# Patient Record
Sex: Female | Born: 1940 | Race: White | Hispanic: No | Marital: Married
Health system: Midwestern US, Community
[De-identification: ages and names within clinical notes are randomized; demographics above are authoritative.]

## PROBLEM LIST (undated history)

## (undated) DIAGNOSIS — M5416 Radiculopathy, lumbar region: Secondary | ICD-10-CM

## (undated) DIAGNOSIS — G894 Chronic pain syndrome: Secondary | ICD-10-CM

## (undated) DIAGNOSIS — G5601 Carpal tunnel syndrome, right upper limb: Secondary | ICD-10-CM

## (undated) DIAGNOSIS — G309 Alzheimer's disease, unspecified: Secondary | ICD-10-CM

## (undated) DIAGNOSIS — F02B0 Dementia in other diseases classified elsewhere, moderate, without behavioral disturbance, psychotic disturbance, mood disturbance, and anxiety: Secondary | ICD-10-CM

## (undated) DIAGNOSIS — R1013 Epigastric pain: Secondary | ICD-10-CM

## (undated) DIAGNOSIS — G8929 Other chronic pain: Secondary | ICD-10-CM

## (undated) DIAGNOSIS — M353 Polymyalgia rheumatica: Secondary | ICD-10-CM

## (undated) DIAGNOSIS — F419 Anxiety disorder, unspecified: Secondary | ICD-10-CM

## (undated) DIAGNOSIS — K219 Gastro-esophageal reflux disease without esophagitis: Secondary | ICD-10-CM

## (undated) DIAGNOSIS — F32A Depression, unspecified: Secondary | ICD-10-CM

## (undated) DIAGNOSIS — F329 Major depressive disorder, single episode, unspecified: Secondary | ICD-10-CM

## (undated) DIAGNOSIS — N819 Female genital prolapse, unspecified: Secondary | ICD-10-CM

## (undated) DIAGNOSIS — M5136 Other intervertebral disc degeneration, lumbar region: Secondary | ICD-10-CM

## (undated) DIAGNOSIS — M549 Dorsalgia, unspecified: Secondary | ICD-10-CM

## (undated) DIAGNOSIS — M51369 Other intervertebral disc degeneration, lumbar region without mention of lumbar back pain or lower extremity pain: Principal | ICD-10-CM

## (undated) DIAGNOSIS — M810 Age-related osteoporosis without current pathological fracture: Secondary | ICD-10-CM

## (undated) DIAGNOSIS — F03B Unspecified dementia, moderate, without behavioral disturbance, psychotic disturbance, mood disturbance, and anxiety (HCC): Secondary | ICD-10-CM

## (undated) DIAGNOSIS — G479 Sleep disorder, unspecified: Secondary | ICD-10-CM

## (undated) HISTORY — DX: Dorsalgia, unspecified: M54.9

## (undated) HISTORY — PX: VEIN LIGATION AND STRIPPING: SHX2653

## (undated) HISTORY — DX: Polymyalgia rheumatica: M35.3

## (undated) HISTORY — PX: CATARACT EXTRACTION: SUR2

## (undated) HISTORY — DX: Other chronic pain: G89.29

## (undated) HISTORY — PX: HAMMER TOE SURGERY: SHX385

## (undated) HISTORY — DX: Female genital prolapse, unspecified: N81.9

---

## 2003-01-24 HISTORY — PX: ESOPHAGOGASTRODUODENOSCOPY: SHX1529

## 2005-09-07 ENCOUNTER — Ambulatory Visit: Payer: Self-pay | Admitting: Internal Medicine

## 2006-04-05 ENCOUNTER — Ambulatory Visit: Payer: Self-pay | Admitting: Internal Medicine

## 2006-09-12 ENCOUNTER — Ambulatory Visit: Payer: Self-pay | Admitting: Internal Medicine

## 2006-09-17 ENCOUNTER — Ambulatory Visit (HOSPITAL_COMMUNITY): Admission: RE | Admit: 2006-09-17 | Discharge: 2006-09-17 | Payer: Self-pay | Admitting: Internal Medicine

## 2010-01-23 HISTORY — PX: OTHER SURGICAL HISTORY: SHX169

## 2010-05-13 ENCOUNTER — Emergency Department (HOSPITAL_COMMUNITY)
Admission: EM | Admit: 2010-05-13 | Discharge: 2010-05-13 | Disposition: A | Discharge status: Nursing Home (Includes ICF) | Payer: Medicare (Managed Care) | Attending: Emergency Medicine | Admitting: Emergency Medicine

## 2010-05-13 ENCOUNTER — Emergency Department (HOSPITAL_COMMUNITY): Payer: Medicare (Managed Care)

## 2010-05-13 ENCOUNTER — Inpatient Hospital Stay (HOSPITAL_COMMUNITY)
Admission: AD | Admit: 2010-05-13 | Discharge: 2010-05-16 | DRG: 287 | Disposition: A | Discharge status: Home / Self Care | Payer: Medicare (Managed Care) | Source: Other Acute Inpatient Hospital | Attending: Cardiology | Admitting: Cardiology

## 2010-05-13 DIAGNOSIS — I4892 Unspecified atrial flutter: Secondary | ICD-10-CM | POA: Diagnosis present

## 2010-05-13 DIAGNOSIS — I4891 Unspecified atrial fibrillation: Secondary | ICD-10-CM | POA: Insufficient documentation

## 2010-05-13 DIAGNOSIS — E7800 Pure hypercholesterolemia, unspecified: Secondary | ICD-10-CM | POA: Insufficient documentation

## 2010-05-13 DIAGNOSIS — Z7901 Long term (current) use of anticoagulants: Secondary | ICD-10-CM | POA: Insufficient documentation

## 2010-05-13 DIAGNOSIS — M549 Dorsalgia, unspecified: Secondary | ICD-10-CM | POA: Diagnosis present

## 2010-05-13 DIAGNOSIS — R110 Nausea: Secondary | ICD-10-CM | POA: Insufficient documentation

## 2010-05-13 DIAGNOSIS — R55 Syncope and collapse: Secondary | ICD-10-CM | POA: Diagnosis present

## 2010-05-13 DIAGNOSIS — R0789 Other chest pain: Principal | ICD-10-CM | POA: Diagnosis present

## 2010-05-13 DIAGNOSIS — M810 Age-related osteoporosis without current pathological fracture: Secondary | ICD-10-CM | POA: Diagnosis present

## 2010-05-13 LAB — PROTIME-INR
INR: 2.57 — ABNORMAL HIGH (ref 0.00–1.49)
Prothrombin Time: 27.7 seconds — ABNORMAL HIGH (ref 11.6–15.2)

## 2010-05-13 LAB — DIFFERENTIAL
Basophils Absolute: 0 10*3/uL (ref 0.0–0.1)
Basophils Relative: 0 % (ref 0–1)
Neutro Abs: 4.1 10*3/uL (ref 1.7–7.7)
Neutrophils Relative %: 58 % (ref 43–77)

## 2010-05-13 LAB — CBC
Hemoglobin: 13.2 g/dL (ref 12.0–15.0)
MCHC: 32 g/dL (ref 30.0–36.0)
RBC: 4.48 MIL/uL (ref 3.87–5.11)

## 2010-05-13 LAB — BASIC METABOLIC PANEL
CO2: 28 mEq/L (ref 19–32)
Calcium: 9.1 mg/dL (ref 8.4–10.5)
Chloride: 103 mEq/L (ref 96–112)
Glucose, Bld: 138 mg/dL — ABNORMAL HIGH (ref 70–99)
Sodium: 138 mEq/L (ref 135–145)

## 2010-05-13 LAB — CARDIAC PANEL(CRET KIN+CKTOT+MB+TROPI)
CK, MB: 3.2 ng/mL (ref 0.3–4.0)
Total CK: 137 U/L (ref 7–177)
Troponin I: 0.21 ng/mL — ABNORMAL HIGH (ref 0.00–0.06)

## 2010-05-13 LAB — CK TOTAL AND CKMB (NOT AT ARMC): Relative Index: 2.3 (ref 0.0–2.5)

## 2010-05-13 LAB — POCT CARDIAC MARKERS: Myoglobin, poc: 89.8 ng/mL (ref 12–200)

## 2010-05-13 LAB — TROPONIN I: Troponin I: 0.24 ng/mL — ABNORMAL HIGH (ref 0.00–0.06)

## 2010-05-14 LAB — LIPID PANEL
Cholesterol: 202 mg/dL — ABNORMAL HIGH (ref 0–200)
HDL: 73 mg/dL (ref >39)
LDL Cholesterol: 107 mg/dL — ABNORMAL HIGH (ref 0–99)
Triglycerides: 109 mg/dL (ref <150)

## 2010-05-14 LAB — PROTIME-INR: INR: 2.26 — ABNORMAL HIGH (ref 0.00–1.49)

## 2010-05-14 LAB — CARDIAC PANEL(CRET KIN+CKTOT+MB+TROPI): Relative Index: 1.9 (ref 0.0–2.5)

## 2010-05-16 LAB — PROTIME-INR: INR: 1.63 — ABNORMAL HIGH (ref 0.00–1.49)

## 2010-05-31 NOTE — Discharge Summary (Signed)
NAME:  Rebecca Ingram, Rebecca Ingram NO.:  [id number]  MEDICAL RECORD NO.:  [id number]           PATIENT TYPE:  I  LOCATION:  3741                         FACILITY:  [facility]  PHYSICIAN:  Vonna Kotyk R. Jacinto Halim, MD       DATE OF BIRTH:  1940/09/30  DATE OF ADMISSION:  05/13/2010 DATE OF DISCHARGE:  05/16/2010                              DISCHARGE SUMMARY   DISCHARGE DIAGNOSES: 1. Slow atrial flutter with 2:1 conduction 2. Near syncope, probably related to paroxysmal atrial flutter.  No     heart block evident during the hospitalization. 3. Hyperlipidemia, mild. 4. No significant coronary artery disease by cardiac catheterization     done for chest pain.  Chest pain probably of noncardiac etiology.  HISTORY:  [name] is a 70 year old active female with a history of paroxysmal atrial fibrillation who has been evaluated at [facility] for possible ablation.  She was recently been placed on Coumadin to see if she would tolerate Coumadin after the ablation.  She has been followed by Dr. Erenest Rasher at [facility].  She was doing well until day of admission, that is on May 13, 2010.  She had 3 episodes of near syncope that lasted a few seconds.  She was initially seen at an Urgent Care in [place], [place] where she was found to be in atrial flutter and because of severe headache and being on Coumadin, he referred her to go to the emergency room to exclude subdural hematoma.  She was seen at [facility] where she was found to be in atrial flutter with 2:1 conduction with minimally positive troponin at 0.24.  CK-MB was negative for myocardial injury.  She was admitted the hospital and evaluated for the same.  She also had complained of upper abdominal and lower chest discomfort. Because of this, we thought she could potentially have unstable angina and was admitted for further evaluation.  She was ruled out for myocardial infarction.  Peak troponin was 0.24, which was the first  set, the second set was 0.08.  After she was in the hospital, we started her on metoprolol at 12.5 mg p.o. b.i.d.  Over the last 48 hours, she has not had any more further atrial flutter being on metoprolol.  We continued all her home medications otherwise.  She remained stable and underwent cardiac catheterization on the day of discharge, that is May 16, 2010, and was found to have only very minimal plaque in the LAD.  She was felt to be stable for discharge.  After discussing with the family, she does not want to be on Lovenox bridging.  Her INR had drifted down to 1.6 after her Coumadin was held for 2 days.  She used receive 8 mg of Coumadin at home and had been therapeutic on initial admission to the hospital at 2.57.  DISCHARGE MEDICATIONS: 1. Metoprolol XL 25 mg half tablet p.o. q.a.m. 2. Propafenone 150 mg p.o. t.i.d. 3. Zocor 10 mg p.o. at bedtime. 4. Coumadin 8 mg p.o. at bedtime. 5. Opana 5 mg p.o. daily and 10 mg p.o. at bedtime. 6. Oxybutynin 5 mg half  tablet p.o. b.i.d. 7. Gabapentin 600 mg p.o. at bedtime.  She will follow up with her primary care physician in [place] and her cardiologist at [facility].  She has not provided the name of the family care physician to me hence I can not forward this letter.     Cristy Hilts. Jacinto Halim, MD     JRG/MEDQ  D:  05/16/2010  T:  05/16/2010  Job:  [id number]  cc:   Forrestine Him, MD  Electronically Signed by Yates Decamp MD on 05/31/2010 09:07:00 AM

## 2010-05-31 NOTE — H&P (Signed)
NAME:  Rebecca Ingram, Rebecca Ingram NO.:  [id number]  MEDICAL RECORD NO.:  [id number]           PATIENT TYPE:  I  LOCATION:  3741                         FACILITY:  [facility]  PHYSICIAN:  Vonna Kotyk R. Jacinto Halim, MD       DATE OF BIRTH:  05/31/1940  DATE OF ADMISSION:  05/13/2010 DATE OF DISCHARGE:  05/13/2010                             HISTORY & PHYSICAL   REASON FOR ADMISSION:  Acute coronary syndrome, chest pain, non-ST- elevation myocardial infarction.  HISTORY:  Rebecca Ingram is a very pleasant 70 year old female who has history of paroxysmal atrial fibrillation.  Otherwise, no significant cardiovascular history.  She had been doing well until yesterday.  She felt nauseous and dizzy, but otherwise felt well yesterday and this morning when she woke up, she had a total of 3 episodes of near syncope. She stated that while walking to the mailbox, she suddenly felt that she was going to pass out but this resolved immediately.  [facility] she had felt near syncope.  Again, just before she got into the car, she was heading home, she again felt similar symptoms.  She lives in [place] and presented at [facility], an Urgent Care Facility.  There, she was also complaining of headache that had just started a few minutes before that.  Given her headache and history of the patient being on Coumadin, she was told to go to the emergency department.  She was evaluated at [facility] and eventually transferred over here to [facility].  The patient states that she also had started to develop chest discomfort in the middle of her chest.  This started while she was in the emergency department in [facility].  She said it was very mild, 1/10 in intensity, did not daily bother her.  She states at that time, it is pretty much gone.  REVIEW OF SYSTEMS:  She has not had any bleeding complications from Coumadin.  She denies any bowel or bladder  disturbances.  No tarry stools.  No TIA or neurological deficits.  She is not a diabetic.  She does have osteoporosis and also has chronic backache.  Other systems are negative.  PAST MEDICAL HISTORY:  Significant for osteoporosis, atrial fibrillation diagnosed 2 years ago, and is being considered for atrial fibrillation ablation.  History of depression and remote tonsillectomy.  SOCIAL HISTORY:  She lives with her husband.  She is very active.  Does not drink alcohol.  Does not use any tobacco products.  HOME MEDICATIONS: 1. Metoprolol 25 mg 1/2 tablet p.r.n. with palpitations. 2. Oxybutynin 5 mg in the morning and 10 mg in the evening. 3. Propafenone 150 mg t.i.d. 4. Gabapentin, dose is not known at bedtime. 5. Coumadin 8 mg p.o. at bedtime.  ALLERGIES:  To PENICILLIN and ASPARTAME which caused her to have rash. FLU SHOTS is quoted as one of her allergies.  FAMILY HISTORY:  There is no history of premature coronary artery disease.  However, parents had coronary artery disease in their late 12s.  Father also had a stroke in his  109s.  Mother had atrial fibrillation, congestive heart failure.  PHYSICAL EXAMINATION:  GENERAL:  She is moderately built, well- nourished.  She appears to be in no acute distress.  She appears younger than her stated age. VITAL SIGNS:  Temperature of 97.8, pulse is 60 beats with regular respiration 16, blood pressure 120/68 mmHg.  The orthostatics were recorded at [facility] which were negative for any orthostatic changes. CARDIAC:  S1 and S2 was normal without any gallop or murmur. CHEST:  Clear without any rhonchi. ABDOMEN:  Soft, nontender without any organomegaly.  Bowel sounds heard in all 4 quadrants. EXTREMITIES:  Revealed full range of movements without any edema or tenderness.  Her initial EKG done at [facility] at 1426 reveals presence of atrial flutter with 2:1 conduction.  Repeat EKG that was done on May 13, 2010,  revealed presence of sinus rhythm with early repolarization. No evidence of ischemia.  There is left atrial abnormality.  IMPRESSION: 1. Near syncope.  I suspect this could be either sick sinus syndrome     or AV nodal blockade.  The patient is on antiarrhythmic therapy     with flecainide. 2. History of atrial fibrillation being considered for atrial     fibrillation ablation by Dr. Erenest Rasher.  She, in last 2     months, was placed on Coumadin and also placed on propafenone. 3. No history of hyperlipidemia or hypertension.  No history of     transient ischemic attack or strokes.  RECOMMENDATIONS: 1. Chest discomfort that started at [facility] that is     totally resolved.  Her first set of point-of-care markers were     positive with elevated troponin of 0.24.  Her CK and CK-MB were     negative for myocardial injury. 2. Coumadin coagulopathy.  Her INR today is therapeutic at 2.57.  The     patient takes 8 mg of Coumadin at night. 3. Normal CBC and BMP.  RECOMMENDATIONS:  At this point, I am not very convinced that this is acute coronary syndrome.  However, I cannot completely exclude this.  I will continue with the Coumadin tonight and depending on her symptomatology tomorrow, I will decide whether she needs cardiac catheterization or not.  I explained to the patient and her husband regarding proceeding with cardiac catheterization and they understand less than a percent risk of death, stroke, heart attack, urgent bypass, bleeding, infection, but not limited to this.  The patient is Jehovah's Witness and does not want to be transfused.  I also give alternatives of doing stress testing.  The patient states that she has had a stress test within a year and it was told to be normal.  I will continue with aspirin only and tomorrow depending on her INR, we will start her on heparin.  I will consider doing cardiac catheterization on Monday or sooner if her chest  pain would recur or her cardiac markers continued to climb.  Further recommendations will follow.     Cristy Hilts. Jacinto Halim, MD     JRG/MEDQ  D:  05/13/2010  T:  05/14/2010  Job:  [id number]  cc:   Forrestine Him, MD  Electronically Signed by Yates Decamp MD on 05/31/2010 09:07:07 AM

## 2010-05-31 NOTE — Cardiovascular Report (Signed)
NAME:  Rebecca Ingram, Rebecca Ingram NO.:  [id number]  MEDICAL RECORD NO.:  [id number]           PATIENT TYPE:  I  LOCATION:  3741                         FACILITY:  [facility]  PHYSICIAN:  Vonna Kotyk R. Jacinto Halim, MD       DATE OF BIRTH:  1940/04/11  DATE OF PROCEDURE:  05/16/2010 DATE OF DISCHARGE:                           CARDIAC CATHETERIZATION   PROCEDURE PERFORMED: 1. Left ventriculography. 2. Selective right and left coronary arteriography.  INDICATIONS:  Ms. Azelea Seguin is a 70 year old female who has history of paroxysmal atrial fibrillation and is being followed at [facility] for possible ablation.  She has no history of hypertension.  She was admitted to the hospital after she had near-syncopal episodes.  She was ruled out for myocardial infarction; however, her troponins were minimally positive at 0.29.  Her EKG essentially demonstrated slow atrial flutter with 2:1 conduction and converting to sinus rhythm.  I do not suspect acute coronary syndrome; however, because of positive troponins and the patient's preference, she is now brought to the cardiac catheterization lab to evaluate her coronary anatomy.  HEMODYNAMIC DATA:  The left ventricular pressure was 109/80 with end- diastolic pressure of 17 mmHg.  Aortic pressure was 112/47 with a mean of 81 mmHg.  There was no pressure gradient across the aortic valve.  ANGIOGRAPHIC DATA:  Left ventricle.  Left ventricular systolic function was normal with ejection fraction of 60% to 65% without any regional wall motion abnormality.  No significant mitral regurgitation.  Right coronary artery.  Right coronary artery is a large vessel and a dominant vessel.  It is smooth and normal.  Left main coronary artery.  Left main coronary artery is short and bifurcates.  It is normal.  Circumflex coronary artery.  Circumflex coronary artery is a moderate caliber vessel, which is smooth and normal.  LAD.  LAD is a large-caliber vessel.  Gives  origin to a very large diagonal one.  Just after the diagonal one, there is a 20% focal stenosis in the LAD.  Mid to distal LAD also has a 20% focal stenosis. Otherwise, the LAD is large-caliber, smooth, and normal.  Large diagonal one is also normal.  IMPRESSION: 1. No significant coronary arteries by cardiac catheterization. 2. Normal left ventricular systolic function. 3. Mildly elevated left ventricular end-diastolic pressure.  RECOMMENDATIONS:  I discussed the findings with the patient and family. At this point, she will be continued on primary prevention only.  The patient's family states that she has been intolerant to statins; however, I have convinced her to be on a very small dose of Zocor at 10 mg for primary prevention given plaque of 20% in the LAD.  I will be discharging her today.  They do not want Lovenox bridging. Her INR was 1.6 this morning.  Her INR was therapeutic on admission on 8 mg p.o. at bedtime of Coumadin.  We will restart Coumadin at 8 mg before discharge.  She will follow up with the [facility] for further management of her atrial fibrillation, probably now has converted to atrial flutter with propafenone.  TECHNIQUE OF THE PROCEDURE:  Under sterile precautions, using a 6-French right radial access, a 6-French TIG #4 catheter was advanced into the ascending aorta, then selective right and left coronary arteriography was performed.  Same catheter was utilized to perform left ventriculography in the RAO projection.  The catheter was then pulled out of body over exchange length J-wire.  The patient tolerated the procedure well.  There were no immediate complication noted.  Hemostasis was obtained by applying TR band.     Cristy Hilts. Jacinto Halim, MD     JRG/MEDQ  D:  05/16/2010  T:  05/16/2010  Job:  [id number]  cc:   Forrestine Him, MD  Electronically Signed by Yates Decamp MD on 05/31/2010 09:07:13 AM

## 2010-06-07 NOTE — Assessment & Plan Note (Signed)
NAME:  Rebecca Ingram, Rebecca Ingram                 CHART#:  [id number]   DATE:  09/12/2006                       DOB:  [birth year]   CHIEF COMPLAINT:  Followup infectious diarrhea/new problem  epigastric/chest pain.   SUBJECTIVE:  The patient is a 70 year old female who is here for  followup infectious diarrhea. She states that her diarrhea has resolved  and she is doing quite well. Actually she now has the tendency to become  constipation and can go up to three days without a bowel movement. She  is not using laxatives at this time. She complains of a daily burning  discomfort to her epigastrium and chest. She complains of daily  heartburn for the last three months. She also complains of a constant  sharp mid abdominal pain that has been there for about a month. Her  symptoms are worse in the morning and not necessarily associated with  meals. She denies any dysphagia or odynophagia. Denies any nausea or  vomiting. Pain usually lasts between one and three hours. She did try  daily omeprazole 20 mg daily. Did seem to help with the heartburn.  However, it made the abdominal pain worse per her report. She had been  taking Naprosyn about 3-4 times a week for the last six months with a  history of arthritis and degenerative disc disease. Denies any rectal  bleeding or melena. Her heart rate has remained stable. She describes  the pain as a 4/10 on a pain scale.   She had an EGD on Jun 02, 2003 with a history of sore throat and  possible reflux. She was found to have a minimal hiatal hernia and an  otherwise normal examination. Protonix twice daily did not help her  symptoms at that time. She has tried Scientist, research (medical) with no relief. She complains  of increased belching.   CURRENT MEDICATIONS:  See the list from September 12, 2006.   ALLERGIES:  FLU VACCINE.   PHYSICAL EXAMINATION:  VITAL SIGNS: Weight 111 pounds. Height 64 inches.  Temperature 97.7, blood pressure 120/60, pulse 60.  GENERAL: The patient is a  thin, Caucasian female who is alert, oriented  and pleasant and cooperative in no acute distress.  HEENT: Sclerae are  clear and anicteric. Conjunctivae pink. Oropharynx  pink and moist without any lesions.  CHEST: HEART: Regular rate and rhythm. Normal S1, S2. No murmurs,  clicks, rubs or gallops.  ABDOMEN: Positive bowel sounds in all four quadrants. No bruits  auscultated. Soft and nontender, nondistended without palpable mass or  hepatosplenomegaly. No rebound, tenderness or guarding.  EXTREMITIES: Without clubbing or edema bilaterally.  SKIN: Pink, warm and dry without rash or jaundice.   ASSESSMENT:  The patient is a 70 year old female with a history of post  infectious diarrhea which has resolved.   She has a new complaint of burning, chest pain and a sharp  epigastric/mid abdominal pain for the last couple of months. Her  symptoms are suspicious for heartburn and acid reflux. However, she has  not responded to PPI trials previously making this less likely. It is  possible she could have cholelithiasis or gallbladder disease.   PLAN:  1. Gastroesophageal reflux disease and constipation literature given      for her review.  2. Zegerid 40 mg samples to be taken once daily to see if  this      relieves her pain. I have given her a prescription for 30 with one      refill.  3. Abdominal ultrasound.  4. CBC and LFTs today. __________       Lorenza Burton, N.P.  Electronically Signed     R. Roetta Sessions, M.D.  Electronically Signed    KJ/MEDQ  D:  09/13/2006  T:  09/13/2006  Job:  [id number]

## 2012-02-14 IMAGING — CR DG CHEST 2V
2 series · 2 of 2 positions shown · non-contrast
Comparison: None

CLINICAL DATA: Syncope

CHEST - 2 VIEW

[view not recorded (1 of 2)]
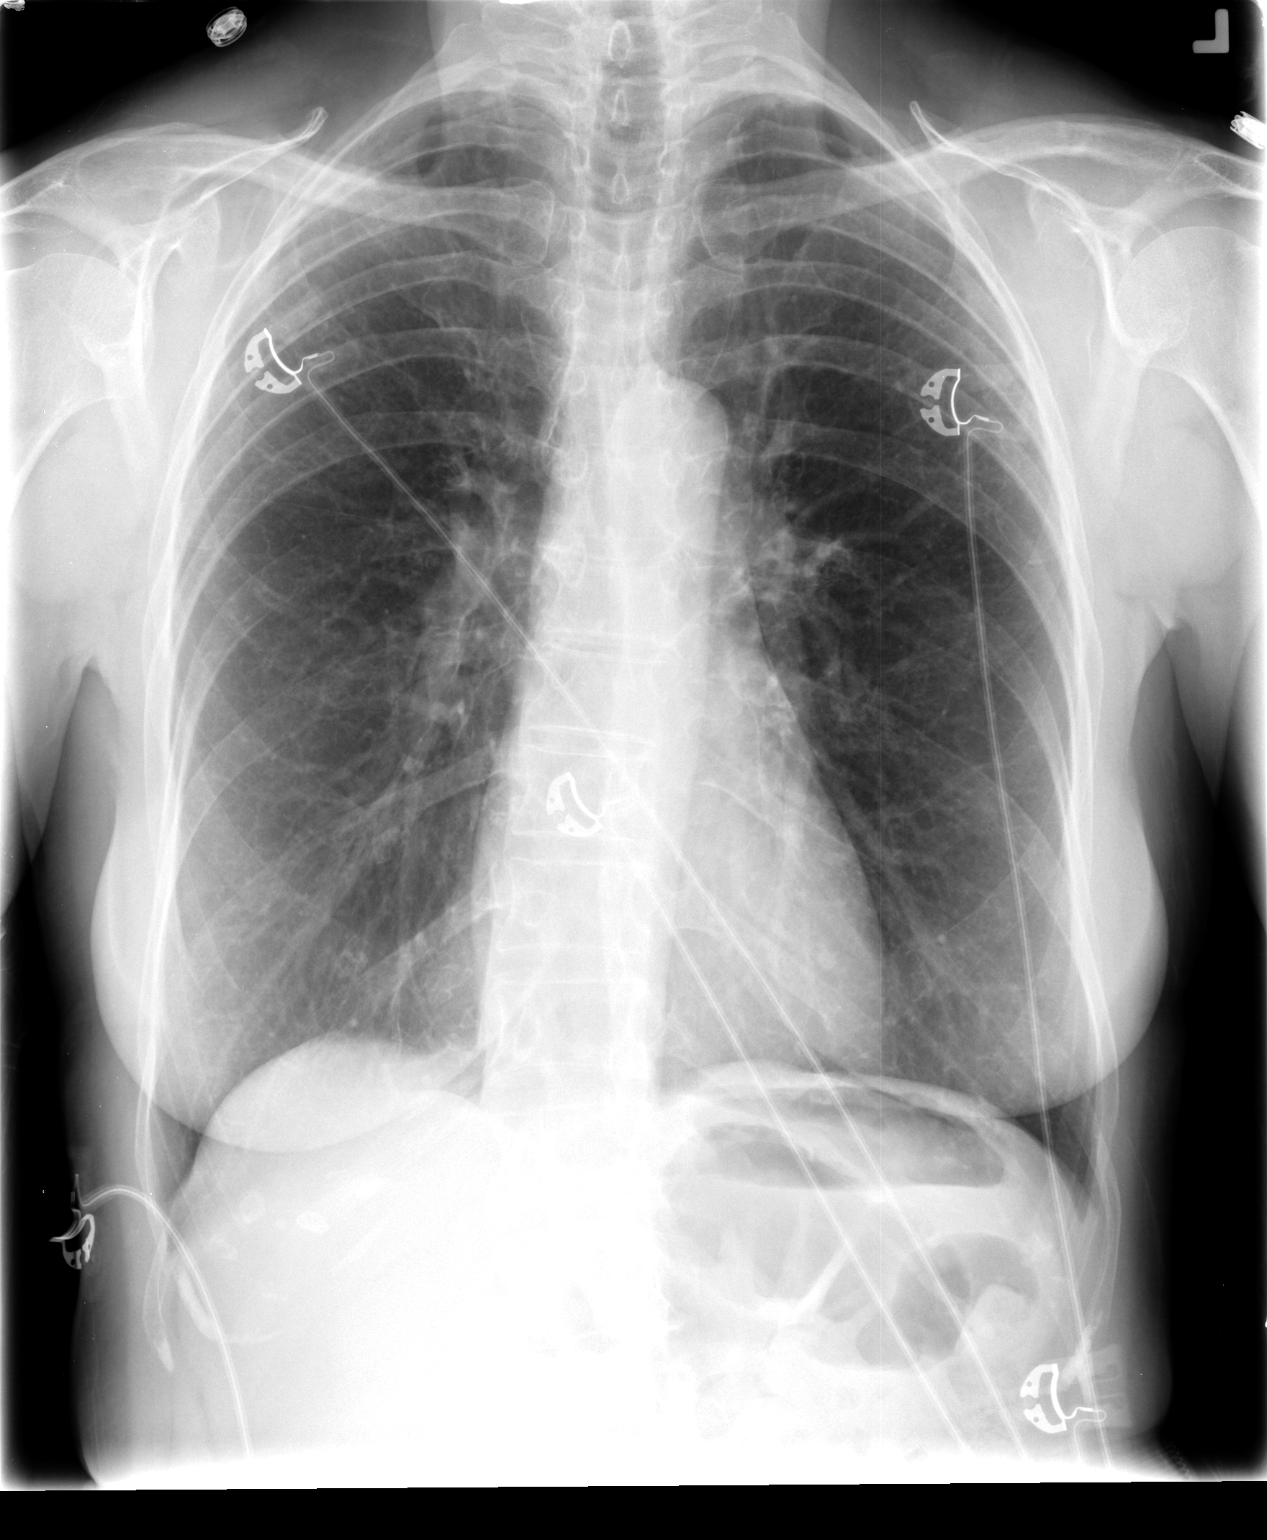

[view not recorded (2 of 2)]
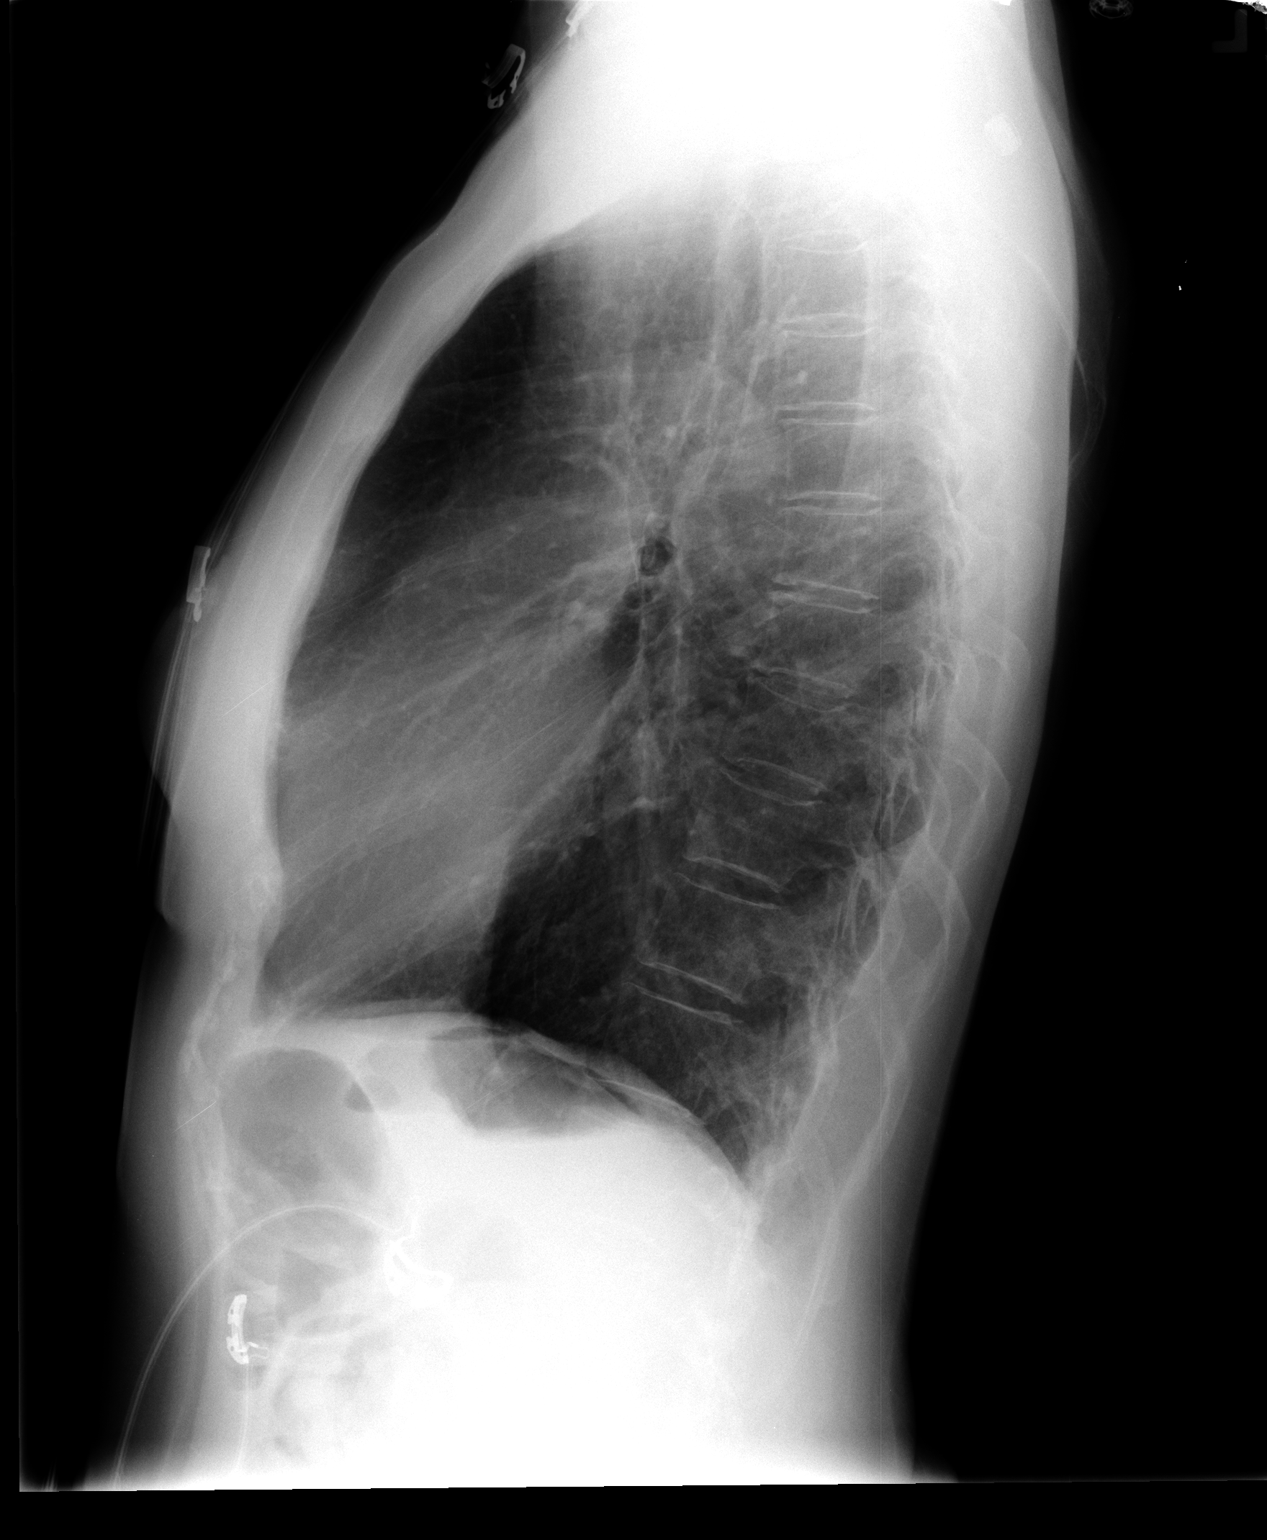

[2 of 2 positions shown; findings below may reference images not displayed]

FINDINGS: Normal heart size, mediastinal contours, and pulmonary vascularity.
Emphysematous changes without infiltrate or effusion.
No pneumothorax.
Bones appear demineralized.
Broad-based dextroconvex thoracolumbar scoliosis.
IMPRESSION: Emphysematous changes.
No acute abnormalities.

## 2013-01-23 HISTORY — PX: SMALL INTESTINE SURGERY: SHX150

## 2014-10-05 ENCOUNTER — Ambulatory Visit (INDEPENDENT_AMBULATORY_CARE_PROVIDER_SITE_OTHER): Payer: Medicare Other | Admitting: Podiatry

## 2014-10-05 ENCOUNTER — Ambulatory Visit (INDEPENDENT_AMBULATORY_CARE_PROVIDER_SITE_OTHER): Payer: Medicare Other

## 2014-10-05 VITALS — BP 137/57 | HR 70 | Resp 16

## 2014-10-05 DIAGNOSIS — L600 Ingrowing nail: Secondary | ICD-10-CM | POA: Diagnosis not present

## 2014-10-05 DIAGNOSIS — M2040 Other hammer toe(s) (acquired), unspecified foot: Secondary | ICD-10-CM

## 2014-10-05 NOTE — Progress Notes (Signed)
   Subjective:    Patient ID: Rebecca Ingram, female    DOB: 04-28-1940, 74 y.o.   MRN: [id number]  HPI Patient presents with bilateral ingrown toenails, great toes, on both sides.  Patient also presents with bilateral hammertoes. Pt stated, "Right foot hurts more".    Review of Systems  All other systems reviewed and are negative.      Objective:   Physical Exam        Assessment & Plan:

## 2014-10-05 NOTE — Patient Instructions (Addendum)
ANTIBACTERIAL SOAP INSTRUCTIONS  THE DAY AFTER PROCEDURE  Please follow the instructions your doctor has marked.   Shower as usual. Before getting out, place a drop of antibacterial liquid soap (Dial) on a wet, clean washcloth.  Gently wipe washcloth over affected area.  Afterward, rinse the area with warm water.  Blot the area dry with a soft cloth and cover with antibiotic ointment (neosporin, polysporin, bacitracin) and band aid or gauze and tape  Place 3-4 drops of antibacterial liquid soap in a quart of warm tap water.  Submerge foot into water for 20 minutes.  If bandage was applied after your procedure, leave on to allow for easy lift off, then remove and continue with soak for the remaining time.  Next, blot area dry with a soft cloth and cover with a bandage.  Apply other medications as directed by your doctor, such as cortisporin otic solution (eardrops) or neosporin antibiotic ointment    Pre-Operative Instructions  Congratulations, you have decided to take an important step to improving your quality of life.  You can be assured that the doctors of [facility] will be with you every step of the way.  1. Plan to be at the surgery center/hospital at least 1 (one) hour prior to your scheduled time unless otherwise directed by the surgical center/hospital staff.  You must have a responsible adult accompany you, remain during the surgery and drive you home.  Make sure you have directions to the surgical center/hospital and know how to get there on time. 2. For hospital based surgery you will need to obtain a history and physical form from your family physician within 1 month prior to the date of surgery- we will give you a form for you primary physician.  3. We make every effort to accommodate the date you request for surgery.  There are however, times where surgery dates or times have to be moved.  We will contact you as soon as possible if a change in schedule is required.   4. No  Aspirin/Ibuprofen for one week before surgery.  If you are on aspirin, any non-steroidal anti-inflammatory medications (Mobic, Aleve, Ibuprofen) you should stop taking it 7 days prior to your surgery.  You make take Tylenol  For pain prior to surgery.  5. Medications- If you are taking daily heart and blood pressure medications, seizure, reflux, allergy, asthma, anxiety, pain or diabetes medications, make sure the surgery center/hospital is aware before the day of surgery so they may notify you which medications to take or avoid the day of surgery. 6. No food or drink after midnight the night before surgery unless directed otherwise by surgical center/hospital staff. 7. No alcoholic beverages 24 hours prior to surgery.  No smoking 24 hours prior to or 24 hours after surgery. 8. Wear loose pants or shorts- loose enough to fit over bandages, boots, and casts. 9. No slip on shoes, sneakers are best. 10. Bring your boot with you to the surgery center/hospital.  Also bring crutches or a walker if your physician has prescribed it for you.  If you do not have this equipment, it will be provided for you after surgery. 11. If you have not been contracted by the surgery center/hospital by the day before your surgery, call to confirm the date and time of your surgery. 12. Leave-time from work may vary depending on the type of surgery you have.  Appropriate arrangements should be made prior to surgery with your employer. 13. Prescriptions will be  provided immediately following surgery by your doctor.  Have these filled as soon as possible after surgery and take the medication as directed. 14. Remove nail polish on the operative foot. 15. Wash the night before surgery.  The night before surgery wash the foot and leg well with the antibacterial soap provided and water paying special attention to beneath the toenails and in between the toes.  Rinse thoroughly with water and dry well with a towel.  Perform this wash  unless told not to do so by your physician.  Enclosed: 1 Ice pack (please put in freezer the night before surgery)   1 Hibiclens skin cleaner   Pre-op Instructions  If you have any questions regarding the instructions, do not hesitate to call our office.  [place]: [address] [place], [place] [address] [phone]  [place]: [address], [place], [place] [address] [phone]  [place]: [address], [place] [address] [phone]  Dr. Santiago Bumpers DPM, Dr. Cristie Hem DPM Dr. Alvan Dame DPM, Dr. Ardelle Anton DPM, Dr. Marlowe Aschoff DPM

## 2014-10-06 ENCOUNTER — Telehealth: Payer: Self-pay | Admitting: *Deleted

## 2014-10-06 NOTE — Progress Notes (Signed)
Subjective:     Patient ID: Rebecca Ingram, female   DOB: Jun 25, 1940, 74 y.o.   MRN: [id number]  HPI patient presents stating I have painful ingrown toenails on both feet and also I have corns on my right foot fifth toe and fourth toe that are very bothersome. Patient states that she's tried to trim and had previous surgery done on the fifth toe that was not successful and she cannot take the pain in the toes. States the ingrown's are also very painful and she cannot take care of them herself   Review of Systems  All other systems reviewed and are negative.      Objective:   Physical Exam  Constitutional: She is oriented to person, place, and time.  Cardiovascular: Intact distal pulses.   Musculoskeletal: Normal range of motion.  Neurological: She is oriented to person, place, and time.  Skin: Skin is warm.  Nursing note and vitals reviewed.  neurovascular status intact muscle strength adequate range of motion within normal limits. Patient's noted to have good digital perfusion is well oriented 3 and is noted to have incurvated hallux nailbeds bilateral with pain in both the medial and lateral corners of both feet. 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 Of right foot at this point I do think the nails need to be fixed and I explained procedure and risk and she wants the surgery. At this time I infiltrated each hallux 60 mg like Marcaine mixture removed the borders exposed matrix and applied phenol 3 applications 30 seconds all 4 borders followed by alcohol lavage and sterile dressing. Gave instructions on soaks and reappoint for that and then discussed toes and also  discussed with her husband this problem and they want it fixed. At this time I have recommended arthroplasty digit for right distal digit 5 right and distal medial exostectomy digit 5 right. I allowed patient to read consent form and spent a great deal of time going over alternative treatments and complications and they're well aware of this and wants surgery and signs consent form after review. I explained told recovery can be upwards of 6 months to one year with no long-term guarantees and they understand this given all preoperative instructions at this time

## 2014-10-06 NOTE — Telephone Encounter (Signed)
Left message for pt at [phone] (Home #) to check to see how they were doing from their ingrown toenail procedure that was done on Monday, October 05, 2014. Waiting for a response.

## 2014-10-07 ENCOUNTER — Telehealth: Payer: Self-pay | Admitting: *Deleted

## 2014-10-07 NOTE — Telephone Encounter (Signed)
Pt states she received a call yesterday from our office, requesting status after her toenail surgery.  Pt states she is fine and looking forward to her surgery on 10/12/2014.

## 2014-10-12 ENCOUNTER — Ambulatory Visit (INDEPENDENT_AMBULATORY_CARE_PROVIDER_SITE_OTHER): Payer: Medicare Other | Admitting: Podiatry

## 2014-10-12 ENCOUNTER — Encounter: Payer: Self-pay | Admitting: Podiatry

## 2014-10-12 VITALS — BP 112/51 | HR 63 | Resp 16

## 2014-10-12 DIAGNOSIS — L600 Ingrowing nail: Secondary | ICD-10-CM

## 2014-10-12 DIAGNOSIS — M2040 Other hammer toe(s) (acquired), unspecified foot: Secondary | ICD-10-CM

## 2014-10-12 NOTE — Patient Instructions (Signed)
Pre-Operative Instructions  Congratulations, you have decided to take an important step to improving your quality of life.  You can be assured that the doctors of [facility] will be with you every step of the way.  1. Plan to be at the surgery center/hospital at least 1 (one) hour prior to your scheduled time unless otherwise directed by the surgical center/hospital staff.  You must have a responsible adult accompany you, remain during the surgery and drive you home.  Make sure you have directions to the surgical center/hospital and know how to get there on time. 2. For hospital based surgery you will need to obtain a history and physical form from your family physician within 1 month prior to the date of surgery- we will give you a form for you primary physician.  3. We make every effort to accommodate the date you request for surgery.  There are however, times where surgery dates or times have to be moved.  We will contact you as soon as possible if a change in schedule is required.   4. No Aspirin/Ibuprofen for one week before surgery.  If you are on aspirin, any non-steroidal anti-inflammatory medications (Mobic, Aleve, Ibuprofen) you should stop taking it 7 days prior to your surgery.  You make take Tylenol  For pain prior to surgery.  5. Medications- If you are taking daily heart and blood pressure medications, seizure, reflux, allergy, asthma, anxiety, pain or diabetes medications, make sure the surgery center/hospital is aware before the day of surgery so they may notify you which medications to take or avoid the day of surgery. 6. No food or drink after midnight the night before surgery unless directed otherwise by surgical center/hospital staff. 7. No alcoholic beverages 24 hours prior to surgery.  No smoking 24 hours prior to or 24 hours after surgery. 8. Wear loose pants or shorts- loose enough to fit over bandages, boots, and casts. 9. No slip on shoes, sneakers are best. 10. Bring  your boot with you to the surgery center/hospital.  Also bring crutches or a walker if your physician has prescribed it for you.  If you do not have this equipment, it will be provided for you after surgery. 11. If you have not been contracted by the surgery center/hospital by the day before your surgery, call to confirm the date and time of your surgery. 12. Leave-time from work may vary depending on the type of surgery you have.  Appropriate arrangements should be made prior to surgery with your employer. 13. Prescriptions will be provided immediately following surgery by your doctor.  Have these filled as soon as possible after surgery and take the medication as directed. 14. Remove nail polish on the operative foot. 15. Wash the night before surgery.  The night before surgery wash the foot and leg well with the antibacterial soap provided and water paying special attention to beneath the toenails and in between the toes.  Rinse thoroughly with water and dry well with a towel.  Perform this wash unless told not to do so by your physician.  Enclosed: 1 Ice pack (please put in freezer the night before surgery)   1 Hibiclens skin cleaner   Pre-op Instructions  If you have any questions regarding the instructions, do not hesitate to call our office.  [place]: [address] [place], [place] [address] [phone]  [place]: [address] [place], [place] [address] [phone]  [place]: [address]  [place], [place] [address] [phone]  Dr. Richard   Tuchman DPM, Dr. Norman Regal DPM Dr. Richard Sikora DPM, Dr. M. Todd Hyatt DPM, Dr. Kathryn Egerton DPM 

## 2014-10-12 NOTE — Progress Notes (Signed)
Subjective:     Patient ID: Rebecca Ingram, female   DOB: February 18, 1940, 74 y.o.   MRN: [id number]  HPI patient states that my nails are doing well and I wanted to discuss again the correction of my hammertoes   Review of Systems     Objective:   Physical Exam Neurovascular status intact no change in health history with patient noted to have well-healed surgical site hallux nails bilateral and keratotic lesions fourth and fifth toes right with rotation of the fifth toe noted    Assessment:     Well-healing surgical site hallux bilateral with hammertoe deformity fourth and fifth toes right foot    Plan:     Reviewed conditions and continue soaks for the ingrown toenails and discussed correction of the digital deformities. Patient wants surgery and understands complications and we reviewed procedures and recovery. Scheduled for outpatient surgery in the next several weeks

## 2014-10-20 ENCOUNTER — Encounter: Payer: Self-pay | Admitting: *Deleted

## 2014-10-20 DIAGNOSIS — M25774 Osteophyte, right foot: Secondary | ICD-10-CM | POA: Diagnosis not present

## 2014-10-20 DIAGNOSIS — M2041 Other hammer toe(s) (acquired), right foot: Secondary | ICD-10-CM

## 2014-10-22 ENCOUNTER — Encounter: Payer: Self-pay | Admitting: Podiatry

## 2014-10-23 ENCOUNTER — Telehealth: Payer: Self-pay | Admitting: *Deleted

## 2014-10-23 NOTE — Telephone Encounter (Signed)
I'm calling to see how you're doing from your surgery.  "I'm doing fairly well.  I'm keeping my foot elevated.  I noticed a little pain in my calf.  It comes and goes though.  I haven't noticed any swelling, fever or redness in it.  Pain medication does marvelous.  Everything was fine at the surgical center."  Keep an eye on your calf.  If you notice any of the mentioned symptoms please call on-call pager number.  Continue to elevate as much as possible.  It's normally recommended to only be up 15 minutes per hour.  Always wear your shoe when you are up and about.  "Okay, thanks for calling to check on me."

## 2014-10-23 NOTE — Progress Notes (Signed)
Surgery performed at [facility] for Hammer Toe Repair 5th distal and Exostectomy 5th right foot.  Prescription was written for Vicodin 10/325 mg, quantity 25.

## 2014-10-30 ENCOUNTER — Ambulatory Visit (INDEPENDENT_AMBULATORY_CARE_PROVIDER_SITE_OTHER): Payer: Medicare Other | Admitting: Podiatry

## 2014-10-30 DIAGNOSIS — M2040 Other hammer toe(s) (acquired), unspecified foot: Secondary | ICD-10-CM

## 2014-10-30 DIAGNOSIS — Z9889 Other specified postprocedural states: Secondary | ICD-10-CM

## 2014-10-31 NOTE — Progress Notes (Signed)
Subjective:     Patient ID: Rebecca Ingram, female   DOB: August 17, 1940, 74 y.o.   MRN: [id number]  HPI patient presents with husband stating she's doing well   Review of Systems     Objective:   Physical Exam Neurovascular status intact negative Homans sign noted fourth and fifth toes right are healing well with wound edges well coapted and good alignment noted    Assessment:     Doing well after digital surgery 45 right    Plan:     Reviewed x-rays reapplied sterile dressing advised on continued open toed shoe gear usage and reduced activity and reappoint 2 weeks for suture removal or earlier if needed

## 2014-11-17 ENCOUNTER — Ambulatory Visit (INDEPENDENT_AMBULATORY_CARE_PROVIDER_SITE_OTHER): Payer: Medicare Other

## 2014-11-17 DIAGNOSIS — M2040 Other hammer toe(s) (acquired), unspecified foot: Secondary | ICD-10-CM

## 2014-11-17 DIAGNOSIS — Z9889 Other specified postprocedural states: Secondary | ICD-10-CM | POA: Diagnosis not present

## 2014-11-17 NOTE — Progress Notes (Signed)
Sutures removed, some remained edges closed and approximated. . Advised pt to gradually transition into regular shoe. She is to f/u with dr Charlsie Merlesegal in 3 weeks for xray and final examination. She is to call with questions or concerns

## 2014-12-10 ENCOUNTER — Ambulatory Visit (INDEPENDENT_AMBULATORY_CARE_PROVIDER_SITE_OTHER): Payer: Medicare Other | Admitting: Podiatry

## 2014-12-10 ENCOUNTER — Ambulatory Visit (INDEPENDENT_AMBULATORY_CARE_PROVIDER_SITE_OTHER): Payer: Medicare Other

## 2014-12-10 DIAGNOSIS — Z9889 Other specified postprocedural states: Secondary | ICD-10-CM

## 2014-12-10 DIAGNOSIS — M2041 Other hammer toe(s) (acquired), right foot: Secondary | ICD-10-CM

## 2014-12-10 DIAGNOSIS — M2040 Other hammer toe(s) (acquired), unspecified foot: Secondary | ICD-10-CM

## 2014-12-11 ENCOUNTER — Ambulatory Visit: Payer: Medicare Other

## 2014-12-11 ENCOUNTER — Encounter: Payer: Medicare Other | Admitting: *Deleted

## 2014-12-12 NOTE — Progress Notes (Signed)
Subjective:     Patient ID: Rebecca Ingram, female   DOB: [birth year], 74 y.o.   MRN: [id number]  HPI patient states I'm doing fine with my right foot with still some persistent swelling and discomfort if him on them too long but overall doing well   Review of Systems     Objective:   Physical Exam Neurovascular status intact muscle strength adequate range of motion within normal limits with well positioned fourth and fifth digit right with no indications of keratotic lesion and negative Homans sign noted    Assessment:     Doing well post digital procedures fourth and fifth toes right with residual discomfort with mild edema still present but improving    Plan:     Reviewed x-rays with patient and recommended continue wider-type shoes and being careful with the types of activities that she does. Patient is discharged and will be seen back as needed if symptoms were to persist or swelling were to get worse

## 2015-03-24 ENCOUNTER — Telehealth: Payer: Self-pay | Admitting: *Deleted

## 2015-03-24 NOTE — Telephone Encounter (Signed)
Pt states she had an ingrown toenail procedure, and part of the toenail has grown back.  I left a message for pt to begin epsom salt soaks, and cover area with antibiotic ointment bandaid until she can get into the office to be seen.  I left message informing pt that occasionally there may be regrowth, that needs to be removed.

## 2015-05-10 ENCOUNTER — Ambulatory Visit: Payer: Medicare (Managed Care) | Admitting: Nurse Practitioner

## 2015-05-12 ENCOUNTER — Encounter: Payer: Self-pay | Admitting: Podiatry

## 2015-05-12 ENCOUNTER — Ambulatory Visit (INDEPENDENT_AMBULATORY_CARE_PROVIDER_SITE_OTHER): Payer: Medicare Other | Admitting: Podiatry

## 2015-05-12 VITALS — BP 107/51 | HR 61 | Resp 16

## 2015-05-12 DIAGNOSIS — L600 Ingrowing nail: Secondary | ICD-10-CM | POA: Diagnosis not present

## 2015-05-12 NOTE — Progress Notes (Signed)
Subjective:     Patient ID: Rebecca Ingram, female   DOB: May 09, 1940, 75 y.o.   MRN: [id number]  HPI patient states my nails have been bothering me on both my feet and it's both borders of the right big one   Review of Systems     Objective:   Physical Exam Neurovascular status intact muscle strength adequate with incurvated medial lateral borders right hallux with redness and lateral border left hallux. There has been previous removals done but it appears that there is been some nail encroachment with the skin and that we'll require more nail removal    Assessment:     Chronic ingrown toenail deformity    Plan:     Reviewed condition to great length and at this time I infiltrated each hallux 60 mg Xylocaine Marcaine mixture and I removed the medial lateral border right hallux lateral border left hallux exposed matrix and applied phenol 3 applications 30 seconds followed by alcohol lavaged each border. Gave instructions on soaks and reappoint may lose the entire hallux nail especially the right one

## 2015-05-12 NOTE — Patient Instructions (Signed)

## 2015-05-21 ENCOUNTER — Telehealth: Payer: Self-pay | Admitting: *Deleted

## 2015-05-21 NOTE — Telephone Encounter (Signed)
Called patient at [phone] (Home #) to check to see how they were doing from their ingrown toenail procedure that was performed on Wednesday, May 12, 2015. Pt stated, "Doing fine and having no pain".

## 2015-05-28 ENCOUNTER — Ambulatory Visit (INDEPENDENT_AMBULATORY_CARE_PROVIDER_SITE_OTHER): Payer: Medicare Other | Admitting: Gastroenterology

## 2015-05-28 ENCOUNTER — Encounter: Payer: Self-pay | Admitting: Gastroenterology

## 2015-05-28 ENCOUNTER — Other Ambulatory Visit: Payer: Self-pay

## 2015-05-28 VITALS — BP 124/62 | HR 65 | Temp 97.8°F | Ht 63.0 in | Wt 122.8 lb

## 2015-05-28 DIAGNOSIS — K5900 Constipation, unspecified: Secondary | ICD-10-CM | POA: Insufficient documentation

## 2015-05-28 MED ORDER — LINACLOTIDE 145 MCG PO CAPS: 145.0000 ug | ORAL_CAPSULE | Freq: Every day | ORAL | Status: DC

## 2015-05-28 MED ORDER — PEG 3350-KCL-NA BICARB-NACL 420 G PO SOLR: 4000.0000 mL | Freq: Once | ORAL | Status: AC

## 2015-05-28 NOTE — Patient Instructions (Signed)
1. Start Linzess daily on empty stomach for constipation. Samples provided and prescription sent to your pharmacy. Please call if not effective and we can increase the dose. 2. Colonoscopy in the near future. Please see separate instructions. Please make sure that your bowel movements are adequate several days preceding her bowel preparation/procedure.

## 2015-05-28 NOTE — Progress Notes (Signed)
Primary Care Physician:  Aniceto Boss, MD  Primary Gastroenterologist:  Roetta Sessions, MD   Chief Complaint  Patient presents with  . Constipation    needs colonoscopy    HPI:  Rebecca Ingram is a 75 y.o. female here To schedule colonoscopy. She states her insurance company keeps encouraging her to have one. She has had chronic constipation for as long as she remembers. She uses stool softeners, Dulcolax, magnesium products as needed. She is on chronic narcotics. She had constipation preceding her narcotic use. Recently tried Movantik without any benefit. Has a bowel movement most days with her current bowel regimen. Occasional bright red blood per rectum which she relates to hemorrhoids. Really no other symptoms. No abdominal pain, heartburn, dysphagia, vomiting, unintentional weight loss.    Current Outpatient Prescriptions  Medication Sig Dispense Refill  . aspirin 81 MG tablet Take 81 mg by mouth daily. Pt takes 2 tablets, with food, at bedtime    . DULoxetine (CYMBALTA) 20 MG capsule TK 2 CS PO QAM. TAKE WITH FOOD. HOLD FOR RAPID HEART RATE  4  . gabapentin (NEURONTIN) 600 MG tablet Take 600 mg by mouth 3 (three) times daily. As needed    . hydrOXYzine (ATARAX/VISTARIL) 10 MG tablet Take 10 mg by mouth 3 (three) times daily as needed.    . NON FORMULARY Tart [place]  1.2 gm daily    . omeprazole (PRILOSEC) 20 MG capsule Take 20 mg by mouth daily.    . OPANA ER, CRUSH RESISTANT, 5 MG T12A     . predniSONE (DELTASONE) 5 MG tablet     . tiZANidine (ZANAFLEX) 2 MG tablet Take by mouth every 6 (six) hours as needed for muscle spasms.     No current facility-administered medications for this visit.    Allergies as of 05/28/2015 - Review Complete 05/28/2015  Allergen Reaction Noted  . Flecainide Anaphylaxis 10/05/2014  . Influenza vaccines Other (See Comments) 10/05/2014  . Iodinated diagnostic agents Shortness Of Breath 10/05/2014  . Penicillins Shortness Of Breath 10/05/2014  .  Simvastatin Other (See Comments) 10/05/2014    Past Medical History  Diagnosis Date  . 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 Polymyalgia rheumatica (HCC)   . Chronic back pain     Past Surgical History  Procedure Laterality Date  . Esophagogastroduodenoscopy  2005    RMR: normal  . Small intestine surgery  2015    bowel blockage, removed 3 inches of small intestines per patient, [facility]  . Heart ablation  2012  . Hammer toe surgery    . Vein ligation and stripping      Family History  Problem Relation Age of Onset  . Colon cancer Neg Hx     Social History   Social History  . Marital Status: Married    Spouse Name: N/A  . Number of Children: N/A  . Years of Education: N/A   Occupational History  . Not on file.   Social History Main Topics  . Smoking status: Never Smoker   . Smokeless tobacco: Not on file     Comment: Never smoked  . Alcohol Use: 0.0 oz/week    0 Standard drinks or equivalent per week     Comment: rare wine or beer  . Drug Use: No  . Sexual Activity: Not on file   Other Topics Concern  . Not on file   Social History Narrative      ROS:  General: Negative for  anorexia, weight loss, fever, chills, fatigue, weakness. Eyes: Negative for vision changes.  ENT: Negative for hoarseness, difficulty swallowing , nasal congestion. CV: Negative for chest pain, angina, palpitations, dyspnea on exertion, peripheral edema.  Respiratory: Negative for dyspnea at rest, dyspnea on exertion, cough, sputum, wheezing.  GI: See history of present illness. GU:  Negative for dysuria, hematuria, urinary incontinence, urinary frequency, nocturnal urination.  MS: Negative for joint pain. Chronic low back pain.  Derm: Negative for rash or itching.  Neuro: Negative for weakness, abnormal sensation, seizure, frequent headaches, memory loss, confusion.  Psych: Negative for anxiety, depression, suicidal ideation, hallucinations.  Endo: Negative for unusual  weight change.  Heme: Negative for bruising or bleeding. Allergy: Negative for rash or hives.    Physical Examination:  BP 124/62 mmHg  Pulse 65  Temp(Src) 97.8 F (36.6 C) (Oral)  Ht 5\' 3"  (1.6 m)  Wt 122 lb 12.8 oz (55.702 kg)  BMI 21.76 kg/m2   General: Well-nourished, well-developed in no acute distress. Accompanied by spouse. Head: Normocephalic, atraumatic.   Eyes: Conjunctiva pink, no icterus. Mouth: Oropharyngeal mucosa moist and pink , no lesions erythema or exudate. Neck: Supple without thyromegaly, masses, or lymphadenopathy.  Lungs: Clear to auscultation bilaterally.  Heart: Regular rate and rhythm, no murmurs rubs or gallops.  Abdomen: Bowel sounds are normal, nontender, nondistended, no hepatosplenomegaly or masses, no abdominal bruits or    hernia , no rebound or guarding.   Rectal: deferred Extremities: No lower extremity edema. No clubbing or deformities.  Neuro: Alert and oriented x 4 , grossly normal neurologically.  Skin: Warm and dry, no rash or jaundice.   Psych: Alert and cooperative, normal mood and affect.    Imaging Studies: No results found.

## 2015-05-28 NOTE — Assessment & Plan Note (Signed)
75 year old female who presents to schedule colonoscopy at the request of her insurance company. She has had no colonoscopy at least 10 years. Complains of chronic constipation. Constipation precedes chronic narcotic therapy. No benefit on Movantik recently. Trial of Linzess 145 g daily. This would take the place of over-the-counter laxatives. She needs to call if ineffective so that we can adjust dose accordingly. Plan on colonoscopy in the upcoming future. Deep sedation in the OR given her chronic narcotic therapy.  I have discussed the risks, alternatives, benefits with regards to but not limited to the risk of reaction to medication, bleeding, infection, perforation and the patient is agreeable to proceed. Written consent to be obtained.

## 2015-05-31 ENCOUNTER — Other Ambulatory Visit: Payer: Self-pay

## 2015-05-31 ENCOUNTER — Telehealth: Payer: Self-pay

## 2015-05-31 MED ORDER — LINACLOTIDE 145 MCG PO CAPS: 145.0000 ug | ORAL_CAPSULE | Freq: Every day | ORAL | Status: AC

## 2015-05-31 NOTE — Telephone Encounter (Signed)
noted 

## 2015-05-31 NOTE — Telephone Encounter (Signed)
Pt called and left voicemail. She [facility] [place] [facility] [address] [facility]. [facility] [place] and [facility].

## 2015-05-31 NOTE — Progress Notes (Signed)
CC'D TO PCP °

## 2015-06-04 NOTE — Patient Instructions (Signed)
Rebecca Ingram  06/04/2015     @PREFPERIOPPHARMACY @   Your procedure is scheduled on 06/10/2015.  [facility] at [facility]:00 [facility].M.  Call this number if you have problems the morning of surgery:  [phone]   Remember:  Do not eat food or drink liquids after midnight.  Take these medicines the morning of surgery with A SIP OF WATER : NEURONTIN, CYMBALTA AND PRILOSEC   Do not wear jewelry, make-up or nail polish.  Do not wear lotions, powders, or perfumes.  You may wear deodorant.  Do not shave 48 hours prior to surgery.  Men may shave face and neck.  Do not bring valuables to the hospital.  [facility] is not responsible for any belongings or valuables.  Contacts, dentures or bridgework may not be worn into surgery.  Leave your suitcase in the car.  After surgery it may be brought to your room.  For patients admitted to the hospital, discharge time will be determined by your treatment team.  Patients discharged the day of surgery will not be allowed to drive home.   Name and phone number of your driver: FAMILY  Special instructions:  N/A Please read over the following fact sheets that you were given. Care and Recovery After Surgery    Colonoscopy A colonoscopy is an exam to look at the entire large intestine (colon). This exam can help find problems such as tumors, polyps, inflammation, and areas of bleeding. The exam takes about 1 hour.  LET [facility] KNOW ABOUT:   Any allergies you have.  All medicines you are taking, including vitamins, herbs, eye drops, creams, and over-the-counter medicines.  Previous problems you or members of your family have had with the use of anesthetics.  Any blood disorders you have.  Previous surgeries you have had.  Medical conditions you have. RISKS AND COMPLICATIONS  Generally, this is a safe procedure. However, as with any procedure, complications can occur. Possible complications include:  Bleeding.  Tearing or  rupture of the colon wall.  Reaction to medicines given during the exam.  Infection (rare). BEFORE THE PROCEDURE   Ask your health care provider about changing or stopping your regular medicines.  You may be prescribed an oral bowel prep. This involves drinking a large amount of medicated liquid, starting the day before your procedure. The liquid will cause you to have multiple loose stools until your stool is almost clear or light green. This cleans out your colon in preparation for the procedure.  Do not eat or drink anything else once you have started the bowel prep, unless your health care provider tells you it is safe to do so.  Arrange for someone to drive you home after the procedure. PROCEDURE   You will be given medicine to help you relax (sedative).  You will lie on your side with your knees bent.  A long, flexible tube with a light and camera on the end (colonoscope) will be inserted through the rectum and into the colon. The camera sends video back to a computer screen as it moves through the colon. The colonoscope also releases carbon dioxide gas to inflate the colon. This helps your health care provider see the area better.  During the exam, your health care provider may take a small tissue sample (biopsy) to be examined under a microscope if any abnormalities are found.  The exam is finished when the entire colon has been viewed. AFTER THE PROCEDURE   Do  not drive for 24 hours after the exam.  You may have a small amount of blood in your stool.  You may pass moderate amounts of gas and have mild abdominal cramping or bloating. This is caused by the gas used to inflate your colon during the exam.  Ask when your test results will be ready and how you will get your results. Make sure you get your test results.   This information is not intended to replace advice given to you by your health care provider. Make sure you discuss any questions you have with your health care  provider.   Document Released: 01/07/2000 Document Revised: 10/30/2012 Document Reviewed: 09/16/2012 Elsevier Interactive Patient Education [employer].

## 2015-06-07 ENCOUNTER — Encounter (HOSPITAL_COMMUNITY): Payer: Self-pay

## 2015-06-07 ENCOUNTER — Other Ambulatory Visit: Payer: Self-pay

## 2015-06-07 ENCOUNTER — Encounter (HOSPITAL_COMMUNITY)
Admission: RE | Admit: 2015-06-07 | Discharge: 2015-06-07 | Disposition: A | Discharge status: Home / Self Care | Payer: Medicare Other | Source: Ambulatory Visit | Attending: Internal Medicine | Admitting: Internal Medicine

## 2015-06-07 DIAGNOSIS — F418 Other specified anxiety disorders: Secondary | ICD-10-CM | POA: Diagnosis not present

## 2015-06-07 DIAGNOSIS — Z1211 Encounter for screening for malignant neoplasm of colon: Secondary | ICD-10-CM | POA: Diagnosis present

## 2015-06-07 DIAGNOSIS — Q438 Other specified congenital malformations of intestine: Secondary | ICD-10-CM | POA: Diagnosis not present

## 2015-06-07 DIAGNOSIS — Z79899 Other long term (current) drug therapy: Secondary | ICD-10-CM | POA: Diagnosis not present

## 2015-06-07 DIAGNOSIS — Z01810 Encounter for preprocedural cardiovascular examination: Secondary | ICD-10-CM | POA: Diagnosis not present

## 2015-06-07 DIAGNOSIS — K219 Gastro-esophageal reflux disease without esophagitis: Secondary | ICD-10-CM | POA: Diagnosis not present

## 2015-06-07 DIAGNOSIS — Z7982 Long term (current) use of aspirin: Secondary | ICD-10-CM | POA: Diagnosis not present

## 2015-06-07 DIAGNOSIS — Z01812 Encounter for preprocedural laboratory examination: Secondary | ICD-10-CM | POA: Diagnosis not present

## 2015-06-07 DIAGNOSIS — K6389 Other specified diseases of intestine: Secondary | ICD-10-CM | POA: Diagnosis not present

## 2015-06-07 HISTORY — DX: Major depressive disorder, single episode, unspecified: F32.9

## 2015-06-07 HISTORY — DX: Gastro-esophageal reflux disease without esophagitis: K21.9

## 2015-06-07 HISTORY — DX: Anxiety disorder, unspecified: F41.9

## 2015-06-07 HISTORY — DX: Depression, unspecified: F32.A

## 2015-06-07 HISTORY — DX: Other intervertebral disc degeneration, lumbar region: M51.36

## 2015-06-07 LAB — CBC WITH DIFFERENTIAL/PLATELET
BASOS ABS: 0 10*3/uL (ref 0.0–0.1)
Basophils Relative: 0 %
Eosinophils Absolute: 0.1 10*3/uL (ref 0.0–0.7)
Eosinophils Relative: 2 %
HEMATOCRIT: 38.3 % (ref 36.0–46.0)
Hemoglobin: 12.2 g/dL (ref 12.0–15.0)
LYMPHS PCT: 23 %
Lymphs Abs: 2 10*3/uL (ref 0.7–4.0)
MCH: 28.3 pg (ref 26.0–34.0)
MCHC: 31.9 g/dL (ref 30.0–36.0)
MCV: 88.9 fL (ref 78.0–100.0)
Monocytes Absolute: 0.6 10*3/uL (ref 0.1–1.0)
Monocytes Relative: 7 %
NEUTROS ABS: 5.8 10*3/uL (ref 1.7–7.7)
Neutrophils Relative %: 68 %
Platelets: 292 10*3/uL (ref 150–400)
RBC: 4.31 MIL/uL (ref 3.87–5.11)
RDW: 14.2 % (ref 11.5–15.5)
WBC: 8.6 10*3/uL (ref 4.0–10.5)

## 2015-06-07 LAB — BASIC METABOLIC PANEL
ANION GAP: 6 (ref 5–15)
BUN: 24 mg/dL — ABNORMAL HIGH (ref 6–20)
CHLORIDE: 100 mmol/L — AB (ref 101–111)
CO2: 28 mmol/L (ref 22–32)
Calcium: 9.3 mg/dL (ref 8.9–10.3)
Creatinine, Ser: 0.64 mg/dL (ref 0.44–1.00)
GFR calc Af Amer: >60 mL/min (ref >60)
GFR calc non Af Amer: >60 mL/min (ref >60)
GLUCOSE: 95 mg/dL (ref 65–99)
POTASSIUM: 4.8 mmol/L (ref 3.5–5.1)
Sodium: 134 mmol/L — ABNORMAL LOW (ref 135–145)

## 2015-06-10 ENCOUNTER — Encounter (HOSPITAL_COMMUNITY): Payer: Self-pay | Admitting: *Deleted

## 2015-06-10 ENCOUNTER — Ambulatory Visit (HOSPITAL_COMMUNITY): Payer: Medicare Other | Admitting: Anesthesiology

## 2015-06-10 ENCOUNTER — Encounter (HOSPITAL_COMMUNITY)
Admission: RE | Disposition: A | Discharge status: Home / Self Care | Payer: Self-pay | Source: Ambulatory Visit | Attending: Internal Medicine

## 2015-06-10 ENCOUNTER — Ambulatory Visit (HOSPITAL_COMMUNITY)
Admission: RE | Admit: 2015-06-10 | Discharge: 2015-06-10 | Disposition: A | Discharge status: Home / Self Care | Payer: Medicare Other | Source: Ambulatory Visit | Attending: Internal Medicine | Admitting: Internal Medicine

## 2015-06-10 DIAGNOSIS — Z1211 Encounter for screening for malignant neoplasm of colon: Secondary | ICD-10-CM

## 2015-06-10 DIAGNOSIS — Z01812 Encounter for preprocedural laboratory examination: Secondary | ICD-10-CM | POA: Insufficient documentation

## 2015-06-10 DIAGNOSIS — Z79899 Other long term (current) drug therapy: Secondary | ICD-10-CM | POA: Insufficient documentation

## 2015-06-10 DIAGNOSIS — Q438 Other specified congenital malformations of intestine: Secondary | ICD-10-CM | POA: Diagnosis not present

## 2015-06-10 DIAGNOSIS — K6389 Other specified diseases of intestine: Secondary | ICD-10-CM | POA: Diagnosis not present

## 2015-06-10 DIAGNOSIS — K219 Gastro-esophageal reflux disease without esophagitis: Secondary | ICD-10-CM | POA: Insufficient documentation

## 2015-06-10 DIAGNOSIS — F418 Other specified anxiety disorders: Secondary | ICD-10-CM | POA: Insufficient documentation

## 2015-06-10 DIAGNOSIS — Z01810 Encounter for preprocedural cardiovascular examination: Secondary | ICD-10-CM | POA: Diagnosis not present

## 2015-06-10 DIAGNOSIS — Z7982 Long term (current) use of aspirin: Secondary | ICD-10-CM | POA: Insufficient documentation

## 2015-06-10 HISTORY — PX: COLONOSCOPY WITH PROPOFOL: SHX5780

## 2015-06-10 SURGERY — COLONOSCOPY WITH PROPOFOL
Anesthesia: Monitor Anesthesia Care

## 2015-06-10 MED ORDER — MIDAZOLAM HCL 5 MG/5ML IJ SOLN
INTRAMUSCULAR | Status: DC | PRN
  Administered 2015-06-10: 2 mg via INTRAVENOUS

## 2015-06-10 MED ORDER — MIDAZOLAM HCL 2 MG/2ML IJ SOLN
1.0000 mg | INTRAMUSCULAR | Status: DC | PRN
  Administered 2015-06-10 (×2): 1 mg via INTRAVENOUS

## 2015-06-10 MED ORDER — PROPOFOL 10 MG/ML IV BOLUS
INTRAVENOUS | Status: AC
  Filled 2015-06-10: qty 20

## 2015-06-10 MED ORDER — FENTANYL CITRATE (PF) 100 MCG/2ML IJ SOLN
25.0000 ug | INTRAMUSCULAR | Status: AC
  Administered 2015-06-10 (×2): 25 ug via INTRAVENOUS

## 2015-06-10 MED ORDER — LACTATED RINGERS IV SOLN
INTRAVENOUS | Status: DC
  Administered 2015-06-10: 09:00:00 via INTRAVENOUS

## 2015-06-10 MED ORDER — FENTANYL CITRATE (PF) 100 MCG/2ML IJ SOLN: 25.0000 ug | INTRAMUSCULAR | Status: DC | PRN

## 2015-06-10 MED ORDER — ONDANSETRON HCL 4 MG/2ML IJ SOLN: 4.0000 mg | Freq: Once | INTRAMUSCULAR | Status: DC | PRN

## 2015-06-10 MED ORDER — PROPOFOL 500 MG/50ML IV EMUL
INTRAVENOUS | Status: DC | PRN
  Administered 2015-06-10: 100 ug/kg/min via INTRAVENOUS

## 2015-06-10 MED ORDER — MIDAZOLAM HCL 2 MG/2ML IJ SOLN
INTRAMUSCULAR | Status: AC
  Filled 2015-06-10: qty 2

## 2015-06-10 MED ORDER — FENTANYL CITRATE (PF) 100 MCG/2ML IJ SOLN
INTRAMUSCULAR | Status: AC
  Filled 2015-06-10: qty 2

## 2015-06-10 NOTE — Anesthesia Post-Procedure Evaluation (Signed)
Formatting of this note is different from the original.   Anesthesia Post Note    Patient: Zoe Martinez    Procedure(s) Performed: Procedure(s) (LRB):  COLONOSCOPY WITH PROPOFOL (N/A)    Patient location during evaluation: PACU  Anesthesia Type: MAC  Level of consciousness: awake, oriented and patient cooperative  Pain management: pain level controlled  Vital Signs Assessment: post-procedure vital signs reviewed and stable  Respiratory status: spontaneous breathing, nonlabored ventilation and respiratory function stable  Cardiovascular status: blood pressure returned to baseline and stable  Postop Assessment: no signs of nausea or vomiting  Anesthetic complications: no    Last Vitals:   Filed Vitals:    06/10/15 0905 06/10/15 0920   BP: 142/54 133/55   Pulse:     Temp:     Resp: 15 10     Last Pain:   Filed Vitals:    06/10/15 0945   PainSc: 4                    IDACAVAGE,ROBERT J      Electronically signed by Despina HiddenIdacavage, Robert J, CRNA at 06/10/2015 10:17 AM EDT

## 2015-06-10 NOTE — Anesthesia Pre-Procedure Evaluation (Signed)
Formatting of this note is different from the original.                                    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    Neuro/Psych  PSYCHIATRIC DISORDERS Anxiety Depression negative psych ROS    GI/Hepatic  GERD  Medicated and Controlled,   Endo/Other      Renal/GU        Musculoskeletal     Abdominal    Peds   Hematology    Anesthesia Other Findings     Reproductive/Obstetrics              Anesthesia Physical  Anesthesia Plan    ASA: III    Anesthesia Plan: MAC     Post-op Pain Management:      Induction: Intravenous    Airway Management Planned: Simple Face Mask    Additional Equipment:     Intra-op Plan:     Post-operative Plan:     Informed Consent: I have reviewed the patients History and Physical, chart, labs and discussed the procedure including the risks, benefits and alternatives for the proposed anesthesia with the patient or authorized representative who has indicated his/her understanding and acceptance.     Plan Discussed with:     Anesthesia Plan Comments:      Anesthesia Quick Evaluation    Electronically signed by Laurene FootmanGonzalez, Luis, MD at 06/10/2015  9:04 AM EDT

## 2015-06-10 NOTE — Unmapped (Signed)
Formatting of this note is different from the original.  Immediate Anesthesia Transfer of Care Note    Patient: Zoe KingdomAlice C Perezgarcia    Procedure(s) Performed: Procedure(s) with comments:  COLONOSCOPY WITH PROPOFOL (N/A) - 0930    Patient Location: PACU    Anesthesia Type:MAC    Level of Consciousness: sedated    Airway & Oxygen Therapy: Patient Spontanous Breathing and Patient connected to face mask oxygen    Post-op Assessment: Report given to RN, Post -op Vital signs reviewed and stable and Patient moving all extremities    Post vital signs: Reviewed and stable    Last Vitals:   Filed Vitals:    06/10/15 0905 06/10/15 0920   BP: 142/54 133/55   Pulse:     Temp:     Resp: 15 10     Last Pain:   Filed Vitals:    06/10/15 0945   PainSc: 4        Patients Stated Pain Goal: 7 (06/10/15 0805)    Complications: No apparent anesthesia complications  Electronically signed by Despina HiddenIdacavage, Robert J, CRNA at 06/10/2015 10:15 AM EDT

## 2015-06-10 NOTE — Anesthesia Preprocedure Evaluation (Signed)
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   Neuro/Psych PSYCHIATRIC DISORDERS Anxiety Depression negative psych ROS   GI/Hepatic GERD  Medicated and Controlled,  Endo/Other    Renal/GU      Musculoskeletal   Abdominal   Peds  Hematology   Anesthesia Other Findings   Reproductive/Obstetrics                             Anesthesia Physical Anesthesia Plan  ASA: III  Anesthesia Plan: MAC   Post-op Pain Management:    Induction: Intravenous  Airway Management Planned: Simple Face Mask  Additional Equipment:   Intra-op Plan:   Post-operative Plan:   Informed Consent: I have reviewed the patients History and Physical, chart, labs and discussed the procedure including the risks, benefits and alternatives for the proposed anesthesia with the patient or authorized representative who has indicated his/her understanding and acceptance.     Plan Discussed with:   Anesthesia Plan Comments:         Anesthesia Quick Evaluation

## 2015-06-10 NOTE — Discharge Instructions (Signed)
Colonoscopy Discharge Instructions  Read the instructions outlined below and refer to this sheet in the next few weeks. These discharge instructions provide you with general information on caring for yourself after you leave the hospital. Your doctor may also give you specific instructions. While your treatment has been planned according to the most current medical practices available, unavoidable complications occasionally occur. If you have any problems or questions after discharge, call Dr. Jena Gauss at [phone]. ACTIVITY  You may resume your regular activity, but move at a slower pace for the next 24 hours.   Take frequent rest periods for the next 24 hours.   Walking will help get rid of the air and reduce the bloated feeling in your belly (abdomen).   No driving for 24 hours (because of the medicine (anesthesia) used during the test).    Do not sign any important legal documents or operate any machinery for 24 hours (because of the anesthesia used during the test).  NUTRITION  Drink plenty of fluids.   You may resume your normal diet as instructed by your doctor.   Begin with a light meal and progress to your normal diet. Heavy or fried foods are harder to digest and may make you feel sick to your stomach (nauseated).   Avoid alcoholic beverages for 24 hours or as instructed.  MEDICATIONS  You may resume your normal medications unless your doctor tells you otherwise.  WHAT YOU CAN EXPECT TODAY  Some feelings of bloating in the abdomen.   Passage of more gas than usual.   Spotting of blood in your stool or on the toilet paper.  IF YOU HAD POLYPS REMOVED DURING THE COLONOSCOPY:  No aspirin products for 7 days or as instructed.   No alcohol for 7 days or as instructed.   Eat a soft diet for the next 24 hours.  FINDING OUT THE RESULTS OF YOUR TEST Not all test results are available during your visit. If your test results are not back during the visit, make an appointment  with your caregiver to find out the results. Do not assume everything is normal if you have not heard from your caregiver or the medical facility. It is important for you to follow up on all of your test results.  SEEK IMMEDIATE MEDICAL ATTENTION IF:  You have more than a spotting of blood in your stool.   Your belly is swollen (abdominal distention).   You are nauseated or vomiting.   You have a temperature over 101.   You have abdominal pain or discomfort that is severe or gets worse throughout the day.    I do not recommend a future colonoscopy unless new GI symptoms develop.  Continue Linzess145 daily or utilize MiraLAX 1-2 doses daily as needed for constipation  Constipation information provided  Office visit in 12 weeksColonoscopy, Care After Refer to this sheet in the next few weeks. These instructions provide you with information on caring for yourself after your procedure. Your health care provider may also give you more specific instructions. Your treatment has been planned according to current medical practices, but problems sometimes occur. Call your health care provider if you have any problems or questions after your procedure. WHAT TO EXPECT AFTER THE PROCEDURE  After your procedure, it is typical to have the following:  A small amount of blood in your stool.  Moderate amounts of gas and mild abdominal cramping or bloating. HOME CARE INSTRUCTIONS  Do not drive, operate machinery, or sign important documents  for 24 hours.  You may shower and resume your regular physical activities, but move at a slower pace for the first 24 hours.  Take frequent rest periods for the first 24 hours.  Walk around or put a warm pack on your abdomen to help reduce abdominal cramping and bloating.  Drink enough fluids to keep your urine clear or pale yellow.  You may resume your normal diet as instructed by your health care provider. Avoid heavy or fried foods that are hard to  digest.  Avoid drinking alcohol for 24 hours or as instructed by your health care provider.  Only take over-the-counter or prescription medicines as directed by your health care provider.  If a tissue sample (biopsy) was taken during your procedure:  Do not take aspirin or blood thinners for 7 days, or as instructed by your health care provider.  Do not drink alcohol for 7 days, or as instructed by your health care provider.  Eat soft foods for the first 24 hours. SEEK MEDICAL CARE IF: You have persistent spotting of blood in your stool 2-3 days after the procedure. SEEK IMMEDIATE MEDICAL CARE IF:  You have more than a small spotting of blood in your stool.  You pass large blood clots in your stool.  Your abdomen is swollen (distended).  You have nausea or vomiting.  You have a fever.  You have increasing abdominal pain that is not relieved with medicine.   This information is not intended to replace advice given to you by your health care provider. Make sure you discuss any questions you have with your health care provider.   Document Released: 08/24/2003 Document Revised: 10/30/2012 Document Reviewed: 09/16/2012 Elsevier Interactive Patient Education 2016 [employer] Constipation, Adult Constipation is when a person:  Poops (has a bowel movement) less than 3 times a week.  Has a hard time pooping.  Has poop that is dry, hard, or bigger than normal. HOME CARE   Eat foods with a lot of fiber in them. This includes fruits, vegetables, beans, and whole grains such as brown rice.  Avoid fatty foods and foods with a lot of sugar. This includes french fries, hamburgers, cookies, candy, and soda.  If you are not getting enough fiber from food, take products with added fiber in them (supplements).  Drink enough fluid to keep your pee (urine) clear or pale yellow.  Exercise on a regular basis, or as told by your doctor.  Go to the restroom when you feel like you need  to poop. Do not hold it.  Only take medicine as told by your doctor. Do not take medicines that help you poop (laxatives) without talking to your doctor first. GET HELP RIGHT AWAY IF:   You have bright red blood in your poop (stool).  Your constipation lasts more than 4 days or gets worse.  You have belly (abdominal) or butt (rectal) pain.  You have thin poop (as thin as a pencil).  You lose weight, and it cannot be explained. MAKE SURE YOU:   Understand these instructions.  Will watch your condition.  Will get help right away if you are not doing well or get worse.   This information is not intended to replace advice given to you by your health care provider. Make sure you discuss any questions you have with your health care provider.   Document Released: 06/28/2007 Document Revised: 01/30/2014 Document Reviewed: 10/21/2012 Elsevier Interactive Patient Education [employer] Elsevier Inc.

## 2015-06-10 NOTE — Transfer of Care (Signed)
Immediate Anesthesia Transfer of Care Note  Patient: Perfecto KingdomAlice C Ferraris  Procedure(s) Performed: Procedure(s) with comments: COLONOSCOPY WITH PROPOFOL (N/A) - 0930  Patient Location: PACU  Anesthesia Type:MAC  Level of Consciousness: sedated  Airway & Oxygen Therapy: Patient Spontanous Breathing and Patient connected to face mask oxygen  Post-op Assessment: Report given to RN, Post -op Vital signs reviewed and stable and Patient moving all extremities  Post vital signs: Reviewed and stable  Last Vitals:  Filed Vitals:   06/10/15 0905 06/10/15 0920  BP: 142/54 133/55  Pulse:    Temp:    Resp: 15 10    Last Pain:  Filed Vitals:   06/10/15 0945  PainSc: 4       Patients Stated Pain Goal: 7 (06/10/15 0805)  Complications: No apparent anesthesia complications

## 2015-06-10 NOTE — Anesthesia Postprocedure Evaluation (Signed)
Anesthesia Post Note  Patient: Rebecca Ingram  Procedure(s) Performed: Procedure(s) (LRB): COLONOSCOPY WITH PROPOFOL (N/A)  Patient location during evaluation: PACU Anesthesia Type: MAC Level of consciousness: awake, oriented and patient cooperative Pain management: pain level controlled Vital Signs Assessment: post-procedure vital signs reviewed and stable Respiratory status: spontaneous breathing, nonlabored ventilation and respiratory function stable Cardiovascular status: blood pressure returned to baseline and stable Postop Assessment: no signs of nausea or vomiting Anesthetic complications: no    Last Vitals:  Filed Vitals:   06/10/15 0905 06/10/15 0920  BP: 142/54 133/55  Pulse:    Temp:    Resp: 15 10    Last Pain:  Filed Vitals:   06/10/15 0945  PainSc: 4                  Jeromy Borcherding J

## 2015-06-10 NOTE — Interval H&P Note (Signed)
History and Physical Interval Note:  06/10/2015 9:39 AM  Perfecto KingdomAlice C Endo  has presented today for surgery, with the diagnosis of constipation  The various methods of treatment have been discussed with the patient and family. After consideration of risks, benefits and other options for treatment, the patient has consented to  Procedure(s) with comments: COLONOSCOPY WITH PROPOFOL (N/A) - 0930 as a surgical intervention .  The patient's history has been reviewed, patient examined, no change in status, stable for surgery.  I have reviewed the patient's chart and labs.  Questions were answered to the patient's satisfaction.     Vitaly Wanat  No change. Screening colonoscopy per plan. Linzess has worked very well but too expensive. Hasn't really utilized MiraLAX very much for constipation.  The risks, benefits, limitations, alternatives and imponderables have been reviewed with the patient. Questions have been answered. All parties are agreeable.

## 2015-06-10 NOTE — Op Note (Signed)
[facility] Patient Name: Rebecca Ingram Procedure Date: 06/10/2015 9:26 AM MRN: [id number] Date of Birth: 1940-11-26 Attending MD: Gennette Pacobert Michael Elania Crowl , MD CSN: [id number] Age: [age] Admit Type: Outpatient Procedure:                Colonoscopy - screening Indications:              Screening for colorectal malignant neoplasm Providers:                Gennette Pacobert Michael Kambri Dismore, MD, Brain HiltsLurae Albert, RN, Birder Robsonebra                            [place], Technician Referring MD:              Medicines:                Monitored Anesthesia Care Complications:            No immediate complications. Estimated Blood Loss:     Estimated blood loss: none. Procedure:                Pre-Anesthesia Assessment:                           - Prior to the procedure, a History and Physical                            was performed, and patient medications and                            allergies were reviewed. The patient's tolerance of                            previous anesthesia was also reviewed. The risks                            and benefits of the procedure and the sedation                            options and risks were discussed with the patient.                            All questions were answered, and informed consent                            was obtained. Prior Anticoagulants: The patient has                            taken no previous anticoagulant or antiplatelet                            agents. ASA Grade Assessment: II - A patient with                            mild systemic disease. After reviewing the risks  and benefits, the patient was deemed in                            satisfactory condition to undergo the procedure.                           After obtaining informed consent, the colonoscope                            was passed under direct vision. Throughout the                            procedure, the patient's blood pressure, pulse, and             oxygen saturations were monitored continuously. The                            EC-[id number] ([id number]) scope was introduced through                            the anus and advanced to the the cecum, identified                            by appendiceal orifice and ileocecal valve. The                            colonoscopy was performed without difficulty. The                            patient tolerated the procedure well. The quality                            of the bowel preparation was adequate. The                            ileocecal valve, appendiceal orifice, and rectum                            were photographed. The entire colon was well                            visualized. Scope In: 9:49:58 AM Scope Out: 10:07:49 AM Scope Withdrawal Time: 0 hours 6 minutes 26 seconds  Total Procedure Duration: 0 hours 17 minutes 51 seconds  Findings:      The perianal and digital rectal examinations were normal.      A diffuse area of mild melanosis was found in the entire colon.       Otherwise, normal-appearing rectal and colonic mucosa. Redundant colon.       Estimated blood loss: none. Impression:               - Melanosis in the colon.                           - No specimens collected. Moderate Sedation:      Moderate (conscious) sedation  was personally administered by an       anesthesia professional. The following parameters were monitored: oxygen       saturation, heart rate, blood pressure, respiratory rate, EKG, adequacy       of pulmonary ventilation, and response to care. Total physician       intraservice time was 24 minutes. Recommendation:           - Patient has a contact number available for                            emergencies. The signs and symptoms of potential                            delayed complications were discussed with the                            patient. Return to normal activities tomorrow.                            Written discharge  instructions were provided to the                            patient.                           - Resume regular diet.                           - Continue present medications. Continue lens is                            145 daily for constipation. If you cannot afford                            that medication, may utilize MiraLAX 1-2 doses                            daily.                           - No repeat colonoscopy due to age unless new GI                            symptoms develop in the future..                           - Return to GI clinic in 12 weeks. Procedure Code(s):        --- Professional ---                           [phone], Colonoscopy, flexible; diagnostic, including                            collection of specimen(s) by brushing or washing,  when performed (separate procedure) Diagnosis Code(s):        --- Professional ---                           Z12.11, Encounter for screening for malignant                            neoplasm of colon                           K63.89, Other specified diseases of intestine CPT copyright 2016 American Medical Association. All rights reserved. The codes documented in this report are preliminary and upon coder review may  be revised to meet current compliance requirements. Gerrit Friends. Lai Hendriks, MD Gennette Pac, MD 06/10/2015 10:17:13 AM This report has been signed electronically. Number of Addenda: 0

## 2015-06-10 NOTE — H&P (View-Only) (Signed)
Primary Care Physician:  SETTLE,PAUL C, MD  Primary Gastroenterologist:  Michael Rourk, MD   Chief Complaint  Patient presents with  . Constipation    needs colonoscopy    HPI:  Rebecca Ingram is a 75 y.o. female here To schedule colonoscopy. She states her insurance company keeps encouraging her to have one. She has had chronic constipation for as long as she remembers. She uses stool softeners, Dulcolax, magnesium products as needed. She is on chronic narcotics. She had constipation preceding her narcotic use. Recently tried Movantik without any benefit. Has a bowel movement most days with her current bowel regimen. Occasional bright red blood per rectum which she relates to hemorrhoids. Really no other symptoms. No abdominal pain, heartburn, dysphagia, vomiting, unintentional weight loss.    Current Outpatient Prescriptions  Medication Sig Dispense Refill  . aspirin 81 MG tablet Take 81 mg by mouth daily. Pt takes 2 tablets, with food, at bedtime    . DULoxetine (CYMBALTA) 20 MG capsule TK 2 CS PO QAM. TAKE WITH FOOD. HOLD FOR RAPID HEART RATE  4  . gabapentin (NEURONTIN) 600 MG tablet Take 600 mg by mouth 3 (three) times daily. As needed    . hydrOXYzine (ATARAX/VISTARIL) 10 MG tablet Take 10 mg by mouth 3 (three) times daily as needed.    . NON FORMULARY Tart Cherry  1.2 gm daily    . omeprazole (PRILOSEC) 20 MG capsule Take 20 mg by mouth daily.    . OPANA ER, CRUSH RESISTANT, 5 MG T12A     . predniSONE (DELTASONE) 5 MG tablet     . tiZANidine (ZANAFLEX) 2 MG tablet Take by mouth every 6 (six) hours as needed for muscle spasms.     No current facility-administered medications for this visit.    Allergies as of 05/28/2015 - Review Complete 05/28/2015  Allergen Reaction Noted  . Flecainide Anaphylaxis 10/05/2014  . Influenza vaccines Other (See Comments) 10/05/2014  . Iodinated diagnostic agents Shortness Of Breath 10/05/2014  . Penicillins Shortness Of Breath 10/05/2014  .  Simvastatin Other (See Comments) 10/05/2014    Past Medical History  Diagnosis Date  . 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 Polymyalgia rheumatica (HCC)   . Chronic back pain     Past Surgical History  Procedure Laterality Date  . Esophagogastroduodenoscopy  2005    RMR: normal  . Small intestine surgery  2015    bowel blockage, removed 3 inches of small intestines per patient, [facility]  . Heart ablation  2012  . Hammer toe surgery    . Vein ligation and stripping      Family History  Problem Relation Age of Onset  . Colon cancer Neg Hx     Social History   Social History  . Marital Status: Married    Spouse Name: N/A  . Number of Children: N/A  . Years of Education: N/A   Occupational History  . Not on file.   Social History Main Topics  . Smoking status: Never Smoker   . Smokeless tobacco: Not on file     Comment: Never smoked  . Alcohol Use: 0.0 oz/week    0 Standard drinks or equivalent per week     Comment: rare wine or beer  . Drug Use: No  . Sexual Activity: Not on file   Other Topics Concern  . Not on file   Social History Narrative      ROS:  General: Negative for   anorexia, weight loss, fever, chills, fatigue, weakness. Eyes: Negative for vision changes.  ENT: Negative for hoarseness, difficulty swallowing , nasal congestion. CV: Negative for chest pain, angina, palpitations, dyspnea on exertion, peripheral edema.  Respiratory: Negative for dyspnea at rest, dyspnea on exertion, cough, sputum, wheezing.  GI: See history of present illness. GU:  Negative for dysuria, hematuria, urinary incontinence, urinary frequency, nocturnal urination.  MS: Negative for joint pain. Chronic low back pain.  Derm: Negative for rash or itching.  Neuro: Negative for weakness, abnormal sensation, seizure, frequent headaches, memory loss, confusion.  Psych: Negative for anxiety, depression, suicidal ideation, hallucinations.  Endo: Negative for unusual  weight change.  Heme: Negative for bruising or bleeding. Allergy: Negative for rash or hives.    Physical Examination:  BP 124/62 mmHg  Pulse 65  Temp(Src) 97.8 F (36.6 C) (Oral)  Ht 5\' 3"  (1.6 m)  Wt 122 lb 12.8 oz (55.702 kg)  BMI 21.76 kg/m2   General: Well-nourished, well-developed in no acute distress. Accompanied by spouse. Head: Normocephalic, atraumatic.   Eyes: Conjunctiva pink, no icterus. Mouth: Oropharyngeal mucosa moist and pink , no lesions erythema or exudate. Neck: Supple without thyromegaly, masses, or lymphadenopathy.  Lungs: Clear to auscultation bilaterally.  Heart: Regular rate and rhythm, no murmurs rubs or gallops.  Abdomen: Bowel sounds are normal, nontender, nondistended, no hepatosplenomegaly or masses, no abdominal bruits or    hernia , no rebound or guarding.   Rectal: deferred Extremities: No lower extremity edema. No clubbing or deformities.  Neuro: Alert and oriented x 4 , grossly normal neurologically.  Skin: Warm and dry, no rash or jaundice.   Psych: Alert and cooperative, normal mood and affect.    Imaging Studies: No results found.

## 2015-06-14 ENCOUNTER — Encounter (HOSPITAL_COMMUNITY): Payer: Self-pay | Admitting: Internal Medicine

## 2015-09-13 ENCOUNTER — Ambulatory Visit: Payer: Medicare Other | Admitting: Gastroenterology

## 2018-05-22 ENCOUNTER — Ambulatory Visit: Payer: Self-pay | Admitting: Cardiology

## 2020-10-18 ENCOUNTER — Encounter

## 2020-10-21 ENCOUNTER — Ambulatory Visit

## 2020-10-21 NOTE — Interval H&P Note (Signed)
Periop  Notes by Kem Boroughs, RN at 10/21/20 1523                Author: Kem Boroughs, RN  Service: SURGERY  Author Type: Registered Nurse       Filed: 10/21/20 1532  Date of Service: 10/21/20 1523  Status: Signed          Editor: Kem Boroughs, RN (Registered Nurse)                    [facility]   Ambulatory Surgery Unit   Pre-operative Instructions      Surgery/Procedure Date  Monday 10/3            Tentative Arrival Time TBD         1. On the day of your surgery/procedure, please report to the Ambulatory Surgery Unit Registration Desk and sign in at your designated time. The Ambulatory Surgery Unit is located in  [facility] [address] on the [place] side of the hospital across from the [facility] building. Please have all of your health insurance cards, co-payment, and a photo ID.      **TWO adults may accompany you the day of the procedure.  We have limited seating available.  If our waiting room is at capacity, your ride may be asked to remain in their vehicle.  No one under 15  is allowed in the waiting room.  Masks, fully covering the mouth and nose, are required in the waiting room.      2. You must have someone with you to drive you home, as you should not drive a car for 24 hours following anesthesia. Please make arrangements for a responsible adult friend or family member to stay with you for at least the first 24 hours after your  surgery.      3. Do not have anything to eat or drink (including water, gum, mints, coffee, juice) after 11:59 PM on Sunday 10/2. This may not apply to medications prescribed by your physician.  (Please note below  the special instructions with medications to take the morning of surgery, if applicable.)      4. We recommend you do not drink any alcoholic beverages for 24 hours before and after your surgery.      5. Contact your surgeons office for instructions on the following medications: non-steroidal anti-inflammatory drugs (i.e. Advil,  Aleve), vitamins, and supplements. (Some surgeons will want you to stop these medications prior to surgery  and others may allow you to take them)    **If you are currently taking Plavix, Coumadin, Aspirin and/or other blood-thinning agents, contact your surgeon for instructions.** Your surgeon will partner with the physician prescribing these  medications to determine if it is safe to stop or if you need to continue taking. Please do not stop taking these medications without instructions from your surgeon.      6. In an effort to help prevent surgical site infection, we ask that you shower with an anti-bacterial soap (i.e. Dial/Safeguard, or the soap provided to you at your preadmission testing appointment)  for 3 days prior to and on the morning of surgery, using a fresh towel after each shower. (Please begin this process with fresh bed linens.) Do not apply any lotions, powders, or deodorants after the  shower on the day of your procedure. If applicable, please do not shave the operative site for 48 hours prior to surgery.  7. Wear comfortable clothes. Wear glasses instead of contacts. Do not bring any jewelry or money (other than copays or fees as instructed). Do not wear make-up, particularly mascara, the morning of your surgery. Do not wear nail polish, particularly if  you are having foot /hand surgery. Wear your hair loose or down, no ponytails, buns, bobby pins or clips. All body piercings must be removed.        8. You should understand that if you do not follow these instructions your surgery may be cancelled. If your physical condition changes (i.e. fever, cold or flu) please contact your surgeon as soon as possible.      9. It is important that you be on time. If a situation occurs where you may be late, or if you have any questions or problems, please call [phone].      10. Your surgery time may be subject to change. You will receive a phone call the day prior to surgery to confirm your  arrival time.      11. Pediatric patients: please bring a change of clothes, diapers, bottle/sippy cup, pacifier, etc.         Special Instructions:      Take all medications and inhalers, as prescribed, on the morning of surgery with a sip of water         Insulin Dependent Diabetic patients: Take your diabetic medications as prescribed the day before surgery.  Hold all diabetic medications the day of surgery.     If you are scheduled to arrive for surgery after 8:00 AM, and your AM blood sugar is >200, please call Ambulatory Surgery.      I understand a pre-operative phone call will be made to verify my surgery time.  In the event that I am not available, I give permission for a message to be left on my answering service and/or with another person?      Yes      Reviewed instructions via telephone, patient verbalized understanding             ___________________      ___________________      ________________   (Signature of Patient)          (Witness)                   (Date and Time)

## 2020-10-25 ENCOUNTER — Inpatient Hospital Stay: Payer: MEDICARE

## 2020-10-25 MED ORDER — DEXAMETHASONE SODIUM PHOSPHATE 4 MG/ML IJ SOLN
4 mg/mL | INTRAMUSCULAR | Status: AC
Start: 2020-10-25 — End: ?

## 2020-10-25 MED ORDER — LIDOCAINE (PF) 10 MG/ML (1 %) IJ SOLN
10 mg/mL (1 %) | INTRAMUSCULAR | Status: AC
Start: 2020-10-25 — End: ?

## 2020-10-25 MED ORDER — SODIUM CHLORIDE 0.9 % IJ SYRG
Freq: Three times a day (TID) | INTRAMUSCULAR | Status: DC
Start: 2020-10-25 — End: 2020-10-25

## 2020-10-25 MED ORDER — BUPIVACAINE (PF) 0.25 % (2.5 MG/ML) IJ SOLN
0.25 % (2.5 mg/mL) | INTRAMUSCULAR | Status: AC
Start: 2020-10-25 — End: ?

## 2020-10-25 MED ORDER — ONDANSETRON (PF) 4 MG/2 ML INJECTION
4 mg/2 mL | INTRAMUSCULAR | Status: AC
Start: 2020-10-25 — End: ?

## 2020-10-25 MED ORDER — DEXMEDETOMIDINE 100 MCG/ML IV SOLN
100 mcg/mL | INTRAVENOUS | Status: DC | PRN
Start: 2020-10-25 — End: 2020-10-25
  Administered 2020-10-25: 17:00:00 via INTRAVENOUS

## 2020-10-25 MED ORDER — OXYCODONE-ACETAMINOPHEN 5 MG-325 MG TAB
5-325 mg | Freq: Once | ORAL | Status: DC | PRN
Start: 2020-10-25 — End: 2020-10-25

## 2020-10-25 MED ORDER — LIDOCAINE (PF) 10 MG/ML (1 %) IJ SOLN
101 mg/mL (1 %) | INTRAMUSCULAR | Status: DC | PRN
Start: 2020-10-25 — End: 2020-10-25
  Administered 2020-10-25: 17:00:00 via SUBCUTANEOUS

## 2020-10-25 MED ORDER — LACTATED RINGERS IV
INTRAVENOUS | Status: DC
Start: 2020-10-25 — End: 2020-10-25
  Administered 2020-10-25: 16:00:00 via INTRAVENOUS

## 2020-10-25 MED ORDER — PROPOFOL 10 MG/ML IV EMUL
10 mg/mL | INTRAVENOUS | Status: DC | PRN
Start: 2020-10-25 — End: 2020-10-25
  Administered 2020-10-25 (×2): via INTRAVENOUS

## 2020-10-25 MED ORDER — FENTANYL CITRATE (PF) 50 MCG/ML IJ SOLN
50 mcg/mL | INTRAMUSCULAR | Status: DC | PRN
Start: 2020-10-25 — End: 2020-10-25

## 2020-10-25 MED ORDER — PHENYLEPHRINE IN 0.9 % SODIUM CL (40 MCG/ML) IV SYRINGE
0.4 mg/10 mL (40 mcg/mL) | INTRAVENOUS | Status: DC | PRN
Start: 2020-10-25 — End: 2020-10-25
  Administered 2020-10-25: 17:00:00 via INTRAVENOUS

## 2020-10-25 MED ORDER — ONDANSETRON (PF) 4 MG/2 ML INJECTION
4 mg/2 mL | INTRAMUSCULAR | Status: DC | PRN
Start: 2020-10-25 — End: 2020-10-25
  Administered 2020-10-25: 17:00:00 via INTRAVENOUS

## 2020-10-25 MED ORDER — WATER FOR INJECTION, STERILE INJECTION
1 gram | Freq: Once | INTRAMUSCULAR | Status: AC
Start: 2020-10-25 — End: 2020-10-25
  Administered 2020-10-25: 17:00:00 via INTRAVENOUS

## 2020-10-25 MED ORDER — EPHEDRINE 50 MG/5 ML (10 MG/ML) IN NS IV SYRINGE
50 mg/5 mL (10 mg/mL) | INTRAVENOUS | Status: DC | PRN
Start: 2020-10-25 — End: 2020-10-25
  Administered 2020-10-25: 17:00:00 via INTRAVENOUS

## 2020-10-25 MED ORDER — LIDOCAINE (PF) 20 MG/ML (2 %) IJ SOLN
20 mg/mL (2 %) | INTRAMUSCULAR | Status: DC | PRN
Start: 2020-10-25 — End: 2020-10-25
  Administered 2020-10-25: 17:00:00 via INTRAVENOUS

## 2020-10-25 MED ORDER — PROPOFOL 10 MG/ML IV EMUL
10 mg/mL | INTRAVENOUS | Status: DC | PRN
Start: 2020-10-25 — End: 2020-10-25
  Administered 2020-10-25 (×3): via INTRAVENOUS

## 2020-10-25 MED ORDER — FENTANYL CITRATE (PF) 50 MCG/ML IJ SOLN
50 mcg/mL | INTRAMUSCULAR | Status: AC
Start: 2020-10-25 — End: ?

## 2020-10-25 MED ORDER — SODIUM CHLORIDE 0.9 % IJ SYRG
INTRAMUSCULAR | Status: DC | PRN
Start: 2020-10-25 — End: 2020-10-25

## 2020-10-25 MED ORDER — LIDOCAINE (PF) 10 MG/ML (1 %) IJ SOLN
10 mg/mL (1 %) | INTRAMUSCULAR | Status: DC | PRN
Start: 2020-10-25 — End: 2020-10-25

## 2020-10-25 MED ORDER — ONDANSETRON (PF) 4 MG/2 ML INJECTION
4 mg/2 mL | INTRAMUSCULAR | Status: DC | PRN
Start: 2020-10-25 — End: 2020-10-25

## 2020-10-25 MED ORDER — PROPOFOL 10 MG/ML IV EMUL
10 mg/mL | INTRAVENOUS | Status: AC
Start: 2020-10-25 — End: ?

## 2020-10-25 MED ORDER — DEXAMETHASONE SODIUM PHOSPHATE 4 MG/ML IJ SOLN
4 mg/mL | INTRAMUSCULAR | Status: DC | PRN
Start: 2020-10-25 — End: 2020-10-25
  Administered 2020-10-25: 17:00:00 via INTRAVENOUS

## 2020-10-25 MED ORDER — FENTANYL CITRATE (PF) 50 MCG/ML IJ SOLN
50 mcg/mL | INTRAMUSCULAR | Status: DC | PRN
Start: 2020-10-25 — End: 2020-10-25
  Administered 2020-10-25 (×3): via INTRAVENOUS

## 2020-10-25 MED ORDER — DIPHENHYDRAMINE HCL 50 MG/ML IJ SOLN
50 mg/mL | INTRAMUSCULAR | Status: DC | PRN
Start: 2020-10-25 — End: 2020-10-25

## 2020-10-25 MED ORDER — LACTATED RINGERS IV
INTRAVENOUS | Status: DC
Start: 2020-10-25 — End: 2020-10-25

## 2020-10-25 MED FILL — BD POSIFLUSH NORMAL SALINE 0.9 % INJECTION SYRINGE: INTRAMUSCULAR | Qty: 40

## 2020-10-25 MED FILL — PROPOFOL 10 MG/ML IV EMUL: 10 mg/mL | INTRAVENOUS | Qty: 100

## 2020-10-25 MED FILL — LACTATED RINGERS IV: INTRAVENOUS | Qty: 1000

## 2020-10-25 MED FILL — DEXAMETHASONE SODIUM PHOSPHATE 4 MG/ML IJ SOLN: 4 mg/mL | INTRAMUSCULAR | Qty: 3

## 2020-10-25 MED FILL — XYLOCAINE-MPF 10 MG/ML (1 %) INJECTION SOLUTION: 10 mg/mL (1 %) | INTRAMUSCULAR | Qty: 5

## 2020-10-25 MED FILL — ONDANSETRON (PF) 4 MG/2 ML INJECTION: 4 mg/2 mL | INTRAMUSCULAR | Qty: 2

## 2020-10-25 MED FILL — FENTANYL CITRATE (PF) 50 MCG/ML IJ SOLN: 50 mcg/mL | INTRAMUSCULAR | Qty: 2

## 2020-10-25 MED FILL — MARCAINE (PF) 0.25 % (2.5 MG/ML) INJECTION SOLUTION: 0.25 % (2.5 mg/mL) | INTRAMUSCULAR | Qty: 10

## 2020-10-25 MED FILL — CEFAZOLIN 1 GRAM SOLUTION FOR INJECTION: 1 gram | INTRAMUSCULAR | Qty: 2000

## 2020-10-25 MED FILL — DEXAMETHASONE SODIUM PHOSPHATE 4 MG/ML IJ SOLN: 4 mg/mL | INTRAMUSCULAR | Qty: 1

## 2020-10-25 NOTE — Interval H&P Note (Signed)
 Received report from C.Eppes RN for lunch relief. Pt awake, alert, denies discomfort.   1319 HOB elevated, sipping ginger ale.  1336 D/C instructions reviewed with husband and son in conference room and pt at bedside.Pt stateshand aching 3/10 Has PTA pain patch in place and will take tylenol/ibuprofen for pain control at home. To call Dr.Swanstrom if not controlling pain.  1356 Assisted to bathroom, voided adeq amt in toilet. Discharged to home via/wc,accompanied to car per RN. Skin warm and dry, awake and alert. Respirations even, unlabored. Pt and family members questions and concerns addressed prior to discharge. All belongings  (glasses)with pt.

## 2020-10-25 NOTE — Op Note (Signed)
PATIENT NAME:  Zoe Martinez    SURGEON:  Lenard Lance, MD    DATE OF SURGERY:  10/25/2020    LOCATION: [facility]    PREOPERATIVE DIAGNOSIS:    Right  carpal tunnel syndrome    POSTOPERATIVE DIAGNOSIS:  Same    PROCEDURE:  Right Open carpal tunnel decompression             ANESTHESIA:  Local (1% lidocaine with 0.25% bupivicaine in a 1:1 mixture) with sedation     BLOOD LOSS:  Minimal    TOURNIQUET TIME:  7 min    Assistant: Pricilla Holm, PA-C     OPERATIVE INDICATIONS: The patient is a 80 y.o. old female who has developed progressive right carpal tunnel syndrome, unresponsive to all conservative treatment. Symptoms have failed to respond consistently to conservative treatment such that patient has elected to undergo carpal tunnel decompression. She understands the alternatives to surgery, the nature of this elective procedure, the usual recovery, possible variations in healing, and the potential for shortcomings and complications (including but not exclusive to bleeding, infection, scar tenderness, grip weakness, residual numbness, or thenar muscle weakness).         DESCRIPTION  OF PROCEDURE: Patient identified correctly in the pre-operative holding area and correct extremity marked. Was then taken stable to the operating room and placed supine with the operative extremity on a hand table. After sedation was administered by the anesthesia team, the right hand and forearm were prepped and draped in a sterile field. A timeout was taken and the operative site was confirmed. Local anesthesia was instilled into the wound.   The extremity was then elevated and exsanguinated and a forearm tourniquet was inflated to 200 mm of mercury. An incision was made in the proximal palm in line with the radial side of the ring finger from the distal wrist crease to Kaplans line.  Dissection was carried through the subcutaneous tissue and the transverse carpal ligament was visualized.  This was released under direct  visualization first distally followed by proximally and carried up past the wrist wrist crease.  A complete decompression was confirmed by direct visualization of the decompressed median nerve as well as palpation.  No other synovial pathology was noted. The tourniquet was then released. The nerve was noted to become hyperemic. Copious irrigation was performed. Hemostasis was obtained with bipolar cautery and the wound was closed with 3-0 nylon sutures.  A sterile dressing was then applied leaving the fingers free for range of motion.  The patient tolerated the procedure well and was discharged to the recovery area uneventfully.  All instrument needle and lap counts were correct at the end of the case.

## 2020-10-25 NOTE — Interval H&P Note (Signed)
Permission received to review discharge instructions and discuss private health information with Ileta Ofarrell, husband and Sunday Klos, son.

## 2020-10-25 NOTE — Anesthesia Post-Procedure Evaluation (Signed)
Procedure(s):  RIGHT OPEN CARPAL TUNNEL RELEASE (MAC/LOCAL).    MAC    Anesthesia Post Evaluation      Multimodal analgesia: multimodal analgesia used between 6 hours prior to anesthesia start to PACU discharge  Patient location during evaluation: PACU  Patient participation: complete - patient participated  Level of consciousness: awake and alert  Pain management: satisfactory to patient  Airway patency: patent  Anesthetic complications: no  Cardiovascular status: acceptable  Respiratory status: acceptable  Hydration status: acceptable  Post anesthesia nausea and vomiting:  none  Final Post Anesthesia Temperature Assessment:  Normothermia (36.0-37.5 degrees C)      INITIAL Post-op Vital signs:   Vitals Value Taken Time   BP 130/64 10/25/20 1315   Temp 36.9 ??C (98.4 ??F) 10/25/20 1309   Pulse 82 10/25/20 1315   Resp 12 10/25/20 1315   SpO2 99 % 10/25/20 1315

## 2020-10-25 NOTE — H&P (Signed)
HISTORY OF PRESENT ILLNESS  Chief Complaint: Pain and Follow-up of the Right Wrist    Age: 80 y.o. Sex: female     Hand-dominance: Right    History of present illness: Zoe Martinez presents to the clinic for EMG follow up.    Continues to have pain, numbness, and tingling into the fingers with the exception of the pinky finger.     This has been present for about 1 month. Was immobilized in a cast for a wrist fracture for 6 weeks, this was removed about 4 weeks ago.     EMG was performed on 10/20/20 by Dr.Kirby. This was reviewed by myself today. 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 There is also mild left carpal tunnel syndrome that is asymptomatic.    Son and husband are present today.    Pain rating = 3 out of 10     ROS and PMHx reviewed today with no changes    Review of Systems   10/21/2020    Constitutional: Unexplained: Negative  Genitourinary: Frequent Urination: Positive  HEENT: Vision Loss: Negative  Neurological: Memory Loss: Positive  Integumentary: Rash: Negative  Cardiovascular: Palpatations: Negative  Hematologic: Bruises/Bleeds Easily: Negative  Gastrointestinal: Constipation: Positive  Immunological: Seasonal Allergies: Negative  Musculoskeletal: Joint Pain: Negative    Current Outpatient Medications:   Acetaminophen 500 MG capsule, 2 capsule EVERY 6 HOURS (route: oral), Disp: , Rfl:   amLODIPine (NORVASC) 5 MG tablet, 1 tablet EVERY AM (route: oral), Disp: , Rfl:   atorvastatin (LIPITOR) 20 MG tablet, 1 tablet DAILY (route: oral), Disp: , Rfl:   buprenorphine (BUPRENEX) 0.3 MG/ML injection, mg =, IV, every 6 hr, 0 Refill(s), Disp: , Rfl:   Buprenorphine 15 MCG/HR patch weekly, Place on the skin, Disp: , Rfl:   Buprenorphine 20 MCG/HR patch weekly, 1 patch, transdermal weekly WEEKLY (route: transdermal), Disp: , Rfl:   donepezil (ARICEPT) 10 MG tablet, 1 tablet BEDTIME (route: oral), Disp: , Rfl:   DULoxetine (CYMBALTA) 20 MG capsule, 1 capsule 2 TIMES DAILY  (route: oral), Disp: , Rfl:   esomeprazole (NexIUM) 20 MG capsule, Take 20 mg by mouth daily, Disp: , Rfl:   gabapentin (NEURONTIN) 600 MG tablet, Take 600 mg by mouth, Disp: , Rfl:   hydrochlorothiazide (HYDRODIURIL) 25 MG tablet, Take by mouth, Disp: , Rfl:   hydrOXYzine (ATARAX) 10 MG tablet, Take 10 mg by mouth, Disp: , Rfl:   omeprazole (PriLOSEC) 40 MG capsule, 1 capsule EVERY AM (route: oral), Disp: , Rfl:   sucralfate (CARAFATE) 1 g tablet, TAKE 1 TABLET BY MOUTH BEFORE MEAL(S), Disp: , Rfl:   traMADol (ULTRAM) 50 MG tablet, Take 1 tablet by mouth every 6 (six) hours as needed, Disp: , Rfl:     Allergies   Allergen Reactions   Flecainide Anaphylaxis   Influenza Vaccines Other (see comments)   throat closed   Iodinated Diagnostic Agents Shortness of breath   Uncoded Allergy. Allergen: ASPARATANE      Penicillins Shortness of breath   Simvastatin Other (see comments)   Other Reaction: muscle cramps      Past Medical History:   Diagnosis Date   Atrial fibrillation   Dementia   Hyperlipidemia     Past Surgical History:   Procedure Laterality Date   NO RELEVANT ORTHOPAEDIC SURGERIES   NO RELEVANT SURGERIES     Social History     Socioeconomic History   Marital status: Married   Tobacco Use   Smoking  status: Never   Smokeless tobacco: Never   Substance and Sexual Activity   Alcohol use: Not Currently   Drug use: Never     Family History   Problem Relation Age of Onset   No Known Problems Mother   No Known Problems Father   No Known Problems Brother   No Known Problems Sister   No Known Problems Son   No Known Problems Daughter     OBJECTIVE  Vital Signs: height is 5\' 4"  and weight is 125 lb. Her blood pressure is 155/67 (abnormal) and her pulse is 64.     Constitutional: No acute distress. No fatigue. Her body mass index is 21.46 kg/m??.   Head, Eyes, Ears, Nose Throat: Normocephalic. Sclera are nonicteric. PERRL.   Respiratory: No labored breathing. No wheezing or rales. Symmetrical thorax without use of accessory  muscles.   Cardiovascular: No marked edema. Normal rate. No visible lifts or heaves.   Skin: No rashes. Warm and dry. Normal turgor.   Neurological: Gross nonfocal. No marked sensory loss noted. No marked motor loss noted.   Psychiatric: Alert, awake, oriented x3. Normal affect. Linear speech.    MSK:    Right Wrist Examination:    Inspection of the right hand and wrist reveals no swelling, ecchymosis, open wound, evidence of infection, masses or lesions.     Positive Tinels. Positive Durkan's. APB weakness with resisted thumb abduction on exam. There is thenar atrophy. Decreased sensation throughout the median nerve distribution.     Full range of motion all fingers.     Neurovascularly intact distally.     IMAGING / STUDIES      No imaging obtained     EMG  Performed by: , MD  Authorized by: Shanda Howells, PA-C       Electromyography:   Site: Right Arm  Number of right arm muscles studied: 5 or more  Nerve Conduction:   Nerves tested: 7-8  Findings/Interpretation:   Electrodiagnostic Findings  1) Nerve conduction studies of the bilateral upper extremities were significant for absent median sensory and motor potentials on the right and relative median slowing at the left wrist.  2) Needle EMG of the right upper extremity and cervical paraspinals was significant for denervation changes in the APB.    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 Note that the lesion may be more proximal than is typical given her recent fracture.  2) Mild left carpal tunnel syndrome (asymptomatic).  3) No evidence of active cervical radiculopathy.    PROCEDURES  Procedures    ASSESSMENT  1. Right carpal tunnel syndrome     There is no problem list on file for this patient.    PLAN  Treatment Plan:    Diagnosis and treatment options discussed today. Patient has failed to gain significant improvement with conservative  non-surgical treatment. We had a lengthy discussion regarding definitive treatment including open carpal tunnel decompression. She does have severe carpal tunnel with active denervation changes I do worry that she may not get complete relief from this procedure. It will probably take at least a year to see the full effect of the procedure. This is done with a local block, IV sedation, and antibiotics. It is an outpatient surgery which takes 30 minutes approximately. She will need to limit heavy lifting greater than 1-5 lbs for 6 weeks following surgery. We did discuss the standard risks and  benefits, including infection, pain, and need for re-surgery. Patient expressed understanding and agreed to surgical intervention.    Pricilla Holm, PA-C      Date of Surgery Update:  Erynn Vaca was seen and examined.  History and physical has been reviewed. The patient has been examined. There have been no significant clinical changes since the completion of the originally dated History and Physical.    Signed By: Lenard Lance, MD     October 25, 2020 11:56 AM

## 2020-10-25 NOTE — Anesthesia Pre-Procedure Evaluation (Signed)
Relevant Problems   No relevant active problems       Anesthetic History   No history of anesthetic complications            Review of Systems / Medical History  Patient summary reviewed, nursing notes reviewed and pertinent labs reviewed    Pulmonary  Within defined limits                 Neuro/Psych         Dementia (mild )     Cardiovascular  Within defined limits                Exercise tolerance: >4 METS     GI/Hepatic/Renal     GERD: well controlled           Endo/Other        Arthritis ( back)     Other Findings   Comments: Right CTS         Physical Exam    Airway  Mallampati: III  TM Distance: 4 - 6 cm  Neck ROM: normal range of motion   Mouth opening: Normal     Cardiovascular    Rhythm: regular  Rate: normal         Dental    Dentition: Caps/crowns  Comments: x1   Pulmonary  Breath sounds clear to auscultation               Abdominal  GI exam deferred       Other Findings            Anesthetic Plan    ASA: 2  Anesthesia type: MAC          Induction: Intravenous  Anesthetic plan and risks discussed with: Patient

## 2020-11-02 ENCOUNTER — Inpatient Hospital Stay
Admit: 2020-11-02 | Payer: MEDICARE | Attending: Physical Medicine & Rehabilitation | Primary: Student in an Organized Health Care Education/Training Program

## 2020-11-02 DIAGNOSIS — M5416 Radiculopathy, lumbar region: Secondary | ICD-10-CM

## 2020-11-03 ENCOUNTER — Ambulatory Visit
Attending: Student in an Organized Health Care Education/Training Program | Primary: Student in an Organized Health Care Education/Training Program

## 2020-11-03 ENCOUNTER — Ambulatory Visit
Admit: 2020-11-03 | Payer: MEDICARE | Attending: Student in an Organized Health Care Education/Training Program | Primary: Student in an Organized Health Care Education/Training Program

## 2020-11-03 DIAGNOSIS — M353 Polymyalgia rheumatica: Secondary | ICD-10-CM

## 2020-11-03 NOTE — Progress Notes (Signed)
1. "Have you been to the ER, urgent care clinic since your last visit?  Hospitalized since your last visit?" No    2. "Have you seen or consulted any other health care providers outside of the [facility] since your last visit?" No     3. For patients aged 80-75: Has the patient had a colonoscopy / FIT/ Cologuard? NA - based on age      If the patient is female:    4. For patients aged 64-74: Has the patient had a mammogram within the past 2 years? NA - based on age or sex      38. For patients aged 21-65: Has the patient had a pap smear? NA - based on age or sex

## 2020-11-03 NOTE — Progress Notes (Signed)
Progress Notes by Gillian Scarce, MD at 11/03/20 1330                Author: Gillian Scarce, MD  Service: --  Author Type: Physician       Filed: 11/04/20 1023  Encounter Date: 11/03/2020  Status: Addendum          Editor: Gillian Scarce, MD (Physician)          Related Notes: Original Note by Gillian Scarce, MD (Physician) filed at 11/03/20 2118                    Good Help to Those in Need??   [facility]    Internal Medicine   [address]   [address], [address]   [place], [place] [address]   [phone]         Primary Care Visit Note        Assessment/Plan:          Dementia:    - on memantine and donepezil.    - Has neurology appt in Dec with Dr. 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 Sallyanne Kuster   - MRI of spine done yesterday   - currently on gabapentin 300 mg tid, buprenorphine patch, duloxetine 20 mg daily, ibuprofen 200 mg q6h prn      Polymyalgia rheumatica:    - patient and family unaware of the diagnosis but listed on the sheet they brought with them. Presumably why she is taking pred 20 mg daily for the past year or more.    - refer to rheumatology      Hyperlipidemia:    - will obtain lipid panel at next visit when fasting       Constipation: miralax and colase      Pelvic organ prolapse/Cystocele: pessary placed      Hx of basal cell carcinoma/actinic keratosis: previously on tretinoin 0.025% cream.      Atrial fibrillation: s/p ablation at [facility] about 10 years ago.      R CTS: recently underwent repair      Jehovah's Witness: declines blood transfusions      Gillian Scarce, MD        CC:          Chief Complaint       Patient presents with        ?  Establish Care             HPI:        Zoe Martinez is a 80 y.o. female who presents to establish care.      Atrial fibrillation: s/p ablation at [facility] about 10 years ago.  Dementia: on memantine and donepezil. Has neurology appt in Dec with Dr.  Ander Purpura  Hyperlipidemia:    Constipation: miralax and colase   Pelvic organ prolapse/Cystocele: pessary placed   Basal cell carcinoma/actinic keratosis: previously on tretinoin 0.025% cream.   Pain management: sees Dr. Sallyanne Kuster on Friday for chronic back pain, herniated disc. MRI on back done yesterday.    Arthritis/PR: taking 10 mg prednisone bid which she has been taking for years.    Low BP:   - no lightheadedness or dizziness, no fever chills, no change in vision,         ROS:     All 10 point review of systems negative except those mentioned  in the HPI.            Past Medical History:          Active Ambulatory Problems            Diagnosis  Date Noted         ?  Hyperlipidemia, unspecified hyperlipidemia type  11/03/2020     ?  Constipation, unspecified constipation type  11/03/2020     ?  Chronic pain syndrome  11/03/2020     ?  Dementia, unspecified dementia severity, unspecified dementia type, unspecified whether behavioral, psychotic, or mood disturbance or anxiety (HCC)  11/03/2020     ?  Routine physical examination  11/03/2020         ?  Polymyalgia rheumatica (HCC)  11/03/2020          Resolved Ambulatory Problems            Diagnosis  Date Noted        ?  No Resolved Ambulatory Problems          Past Medical History:        Diagnosis  Date         ?  Arthritis       ?  Dementia (HCC)       ?  GERD (gastroesophageal reflux disease)       ?  SBO (small bowel obstruction) (HCC)           ?  Skin cancer                   Current Medications:        Current Outpatient Medications:    ?  Omeprazole delayed release (PRILOSEC D/R) 20 mg tablet, Take 20 mg by mouth daily., Disp: , Rfl:    ?  DULoxetine (CYMBALTA) 20 mg capsule, Take 20 mg by mouth daily., Disp: , Rfl:    ?  memantine (NAMENDA) 10 mg tablet, Take 10 mg by mouth two (2) times a day., Disp: , Rfl:    ?  donepeziL (ARICEPT) 10 mg tablet, Take 10 mg by mouth nightly., Disp: , Rfl:    ?  predniSONE (DELTASONE) 5 mg tablet, Take 10 mg by mouth two (2)  times a day., Disp: , Rfl:    ?  buprenorphine (BUTRANS) 20 mcg/hour, 1 Patch by TransDERmal route every seven (7) days. Indications: severe chronic pain with opioid tolerance,  Disp: , Rfl:    ?  polyethylene glycol (MIRALAX) 17 gram packet, Take 17 g by mouth daily. Indications: constipation, Disp: , Rfl:    ?  docusate sodium (COLACE) 100 mg capsule, Take 100 mg by mouth two (2) times a day. Indications: constipation, Disp: , Rfl:    ?  multivitamin (ONE A DAY) tablet, Take 1 Tablet by mouth daily., Disp: , Rfl:    ?  acetaminophen (TYLENOL) 500 mg tablet, Take 1,000 mg by mouth every six (6) hours as needed for Pain., Disp: , Rfl:    ?  ibuprofen (MOTRIN) 200 mg tablet, Take 600 mg by mouth every six (6) hours as needed for Pain., Disp: , Rfl:    ?  gabapentin (NEURONTIN) 300 mg capsule, Take 300 mg by mouth three (3) times daily., Disp: , Rfl:    ?  famotidine (PEPCID) 20 mg tablet, Take 20 mg by mouth two (2) times a day., Disp: , Rfl:            Past Surgical History:  Past Surgical History:         Procedure  Laterality  Date          ?  HX AFIB ABLATION         ?  HX COLONOSCOPY              ?  HX SMALL BOWEL RESECTION                    Family History:     History reviewed. No pertinent family history.           Social History:          Social History          Socioeconomic History         ?  Marital status:  MARRIED              Spouse name:  Not on file         ?  Number of children:  Not on file     ?  Years of education:  Not on file     ?  Highest education level:  Not on file       Occupational History        ?  Not on file       Tobacco Use         ?  Smoking status:  Never     ?  Smokeless tobacco:  Never       Vaping Use         ?  Vaping Use:  Never used       Substance and Sexual Activity         ?  Alcohol use:  Not Currently     ?  Drug use:  Not Currently     ?  Sexual activity:  Not on file        Other Topics  Concern        ?  Not on file       Social History Narrative        ?   Not on file          Social Determinants of Health          Financial Resource Strain: Low Risk         ?  Difficulty of Paying Living Expenses: Not hard at all       Food Insecurity: No Food Insecurity        ?  Worried About Programme researcher, broadcasting/film/video in the Last Year: Never true     ?  Ran Out of Food in the Last Year: Never true       Transportation Needs: Not on file     Physical Activity: Not on file     Stress: Not on file     Social Connections: Not on file     Intimate Partner Violence: Not on file       Housing Stability: Not on file                     Visit Vitals       BP  (!) 100/45  Comment: manual on R arm        Pulse  77     Temp  98 ??F (36.7 ??C) (Temporal)     Resp  16     Ht  5\' 2"  (1.575 m)  Wt  123 lb (55.8 kg)     SpO2  94%        BMI  22.50 kg/m??             Physical Exam:     General - Well appearing female   HEENT - PERRL, TM no erythema/opacification, normal nasal turbinates, no oropharyngeal erythema or exudate, MMM   Neck - supple, no thyroidomegaly, no lymphadenopathy   Pulm - clear to auscultation bilaterally   Cardio - RRR, normal S1 S2, no murmur   Abd - soft, nontender, no masses, nabs   Extrem - no edema, +2 distal pulses   Neuro-  Alert difficulty with history and memory.

## 2020-11-04 LAB — CBC WITH AUTO DIFFERENTIAL
Basophils %: 0 % (ref 0–1)
Basophils Absolute: 0.1 10*3/uL (ref 0.0–0.1)
Eosinophils %: 3 % (ref 0–7)
Eosinophils Absolute: 0.3 10*3/uL (ref 0.0–0.4)
Granulocyte Absolute Count: 0 10*3/uL (ref 0.00–0.04)
Hematocrit: 38.4 % (ref 35.0–47.0)
Hemoglobin: 11.6 g/dL (ref 11.5–16.0)
Immature Granulocytes: 0 % (ref 0.0–0.5)
Lymphocytes %: 15 % (ref 12–49)
Lymphocytes Absolute: 1.9 10*3/uL (ref 0.8–3.5)
MCH: 30.6 PG (ref 26.0–34.0)
MCHC: 30.2 g/dL (ref 30.0–36.5)
MCV: 101.3 FL — ABNORMAL HIGH (ref 80.0–99.0)
MPV: 11.6 FL (ref 8.9–12.9)
Monocytes %: 8 % (ref 5–13)
Monocytes Absolute: 1 10*3/uL (ref 0.0–1.0)
NRBC Absolute: 0 10*3/uL (ref 0.00–0.01)
Neutrophils %: 74 % (ref 32–75)
Neutrophils Absolute: 9.6 10*3/uL — ABNORMAL HIGH (ref 1.8–8.0)
Nucleated RBCs: 0 PER 100 WBC
Platelets: 229 10*3/uL (ref 150–400)
RBC: 3.79 M/uL — ABNORMAL LOW (ref 3.80–5.20)
RDW: 12.5 % (ref 11.5–14.5)
WBC: 12.9 10*3/uL — ABNORMAL HIGH (ref 3.6–11.0)

## 2020-11-04 LAB — COMPREHENSIVE METABOLIC PANEL
ALT: 20 U/L (ref 12–78)
AST: 16 U/L (ref 15–37)
Albumin/Globulin Ratio: 1.5 (ref 1.1–2.2)
Albumin: 3.9 g/dL (ref 3.5–5.0)
Alkaline Phosphatase: 74 U/L (ref 45–117)
Anion Gap: 6 mmol/L (ref 5–15)
BUN: 27 MG/DL — ABNORMAL HIGH (ref 6–20)
Bun/Cre Ratio: 21 — ABNORMAL HIGH (ref 12–20)
CO2: 27 mmol/L (ref 21–32)
Calcium: 10.1 MG/DL (ref 8.5–10.1)
Chloride: 107 mmol/L (ref 97–108)
Creatinine: 1.27 MG/DL — ABNORMAL HIGH (ref 0.55–1.02)
ESTIMATED GLOMERULAR FILTRATION RATE: 43 mL/min/{1.73_m2} — ABNORMAL LOW (ref >60)
Globulin: 2.6 g/dL (ref 2.0–4.0)
Glucose: 107 mg/dL — ABNORMAL HIGH (ref 65–100)
Potassium: 4.6 mmol/L (ref 3.5–5.1)
Sodium: 140 mmol/L (ref 136–145)
Total Bilirubin: 0.2 MG/DL (ref 0.2–1.0)
Total Protein: 6.5 g/dL (ref 6.4–8.2)

## 2020-11-04 LAB — HEMOGLOBIN A1C W/EAG
Hemoglobin A1C: 6 % — ABNORMAL HIGH (ref 4.0–5.6)
eAG: 126 mg/dL

## 2020-11-04 LAB — TSH 3RD GENERATION
TSH: 0.82 u[IU]/mL (ref 0.36–3.74)
TSH: 0.82 u[IU]/mL (ref 0.36–3.74)

## 2020-11-04 LAB — CBC WITH AUTOMATED DIFF
ABS. BASOPHILS: 0.1 10*3/uL (ref 0.0–0.1)
ABS. EOSINOPHILS: 0.3 10*3/uL (ref 0.0–0.4)
ABS. IMM. GRANS.: 0 10*3/uL (ref 0.00–0.04)
ABS. LYMPHOCYTES: 1.9 10*3/uL (ref 0.8–3.5)
ABS. MONOCYTES: 1 10*3/uL (ref 0.0–1.0)
ABS. NEUTROPHILS: 9.6 10*3/uL — ABNORMAL HIGH (ref 1.8–8.0)
ABSOLUTE NRBC: 0 10*3/uL (ref 0.00–0.01)
BASOPHILS: 0 % (ref 0–1)
EOSINOPHILS: 3 % (ref 0–7)
HCT: 38.4 % (ref 35.0–47.0)
HGB: 11.6 g/dL (ref 11.5–16.0)
IMMATURE GRANULOCYTES: 0 % (ref 0.0–0.5)
LYMPHOCYTES: 15 % (ref 12–49)
MCH: 30.6 PG (ref 26.0–34.0)
MCHC: 30.2 g/dL (ref 30.0–36.5)
MCV: 101.3 FL — ABNORMAL HIGH (ref 80.0–99.0)
MONOCYTES: 8 % (ref 5–13)
MPV: 11.6 FL (ref 8.9–12.9)
NEUTROPHILS: 74 % (ref 32–75)
NRBC: 0 PER 100 WBC
PLATELET: 229 10*3/uL (ref 150–400)
RBC: 3.79 M/uL — ABNORMAL LOW (ref 3.80–5.20)
RDW: 12.5 % (ref 11.5–14.5)
WBC: 12.9 10*3/uL — ABNORMAL HIGH (ref 3.6–11.0)

## 2020-11-04 LAB — HEMOGLOBIN A1C WITH EAG
Est. average glucose: 126 mg/dL
Hemoglobin A1c: 6 % — ABNORMAL HIGH (ref 4.0–5.6)

## 2020-11-04 LAB — METABOLIC PANEL, COMPREHENSIVE
A-G Ratio: 1.5 (ref 1.1–2.2)
ALT (SGPT): 20 U/L (ref 12–78)
AST (SGOT): 16 U/L (ref 15–37)
Albumin: 3.9 g/dL (ref 3.5–5.0)
Alk. phosphatase: 74 U/L (ref 45–117)
Anion gap: 6 mmol/L (ref 5–15)
BUN/Creatinine ratio: 21 — ABNORMAL HIGH (ref 12–20)
BUN: 27 MG/DL — ABNORMAL HIGH (ref 6–20)
Bilirubin, total: 0.2 MG/DL (ref 0.2–1.0)
CO2: 27 mmol/L (ref 21–32)
Calcium: 10.1 MG/DL (ref 8.5–10.1)
Chloride: 107 mmol/L (ref 97–108)
Creatinine: 1.27 MG/DL — ABNORMAL HIGH (ref 0.55–1.02)
Globulin: 2.6 g/dL (ref 2.0–4.0)
Glucose: 107 mg/dL — ABNORMAL HIGH (ref 65–100)
Potassium: 4.6 mmol/L (ref 3.5–5.1)
Protein, total: 6.5 g/dL (ref 6.4–8.2)
Sodium: 140 mmol/L (ref 136–145)
eGFR: 43 mL/min/{1.73_m2} — ABNORMAL LOW (ref >60)

## 2020-11-04 NOTE — Progress Notes (Signed)
Received ROI request from patient on 11/03/20. Faxed request to [employer] on 11/04/20.

## 2020-11-09 ENCOUNTER — Encounter

## 2020-11-10 MED ORDER — GABAPENTIN 300 MG CAP
300 mg | ORAL_CAPSULE | Freq: Three times a day (TID) | ORAL | 2 refills | Status: AC
Start: 2020-11-10 — End: 2020-12-09

## 2020-11-10 NOTE — Telephone Encounter (Signed)
Telephone Encounter by Gillian Scarce, MD at 11/10/20 1256                Author: Gillian Scarce, MD  Service: --  Author Type: Physician       Filed: 11/10/20 1256  Encounter Date: 11/09/2020  Status: Signed          Editor: Gillian Scarce, MD (Physician)          From: Elliot Gurney: Gillian Scarce, MDSent: 11/09/2020  4:19 PM EDTSubject: Refill of gabapentinGood afternoon, Dr. Francesca Jewett: This is Samara Deist  contacting you on behalf of my mother-in-law, Danai Gotto, DOB 02[birth year]01/2040. She saw you as a new patient last week and was referred to a neurologist; however, the appointment is not until 12/09/20 with Dianna Limbo. She takes gabapentin 300 mg three  times a day and is out of the medication as of today. She forgot to ask you about getting a refill when she was there last week. Long story short, would you be able to refill that medication for her until she sees the neurologist? She uses a service called  [facility] for her meds. Or, she could use a local [facility] at [address], [place] [place] [address]. Phone:  [phone]. Any help or advice you could get would be welcomed. Thank you very much! [employer]

## 2020-11-16 MED ORDER — MEMANTINE 10 MG TAB
10 mg | ORAL_TABLET | Freq: Two times a day (BID) | ORAL | 0 refills | Status: AC
Start: 2020-11-16 — End: 2020-12-06

## 2020-11-16 MED ORDER — DULOXETINE 20 MG CAP, DELAYED RELEASE
20 mg | ORAL_CAPSULE | Freq: Every day | ORAL | 1 refills | Status: DC
Start: 2020-11-16 — End: 2020-12-09

## 2020-11-16 NOTE — Telephone Encounter (Signed)
Telephone Encounter by Gillian Scarce, MD at 11/16/20 1658                Author: Gillian Scarce, MD  Service: --  Author Type: Physician       Filed: 11/16/20 1658  Encounter Date: 11/16/2020  Status: Signed          Editor: Gillian Scarce, MD (Physician)          From: Elliot Gurney: Gillian Scarce, MDSent: 11/16/2020 11:20 AM EDTSubject: Prescription Refill RequestsDr. Francesca Jewett, Good morning! My name is  Zoe Martinez Zoe my mother-in-law, Zoe Martinez. Zoe Martinez, DOB 10-02-2040, has recently relocated to [place], [place]. She saw you as a new patient a few weeks ago, Zoe you referred her to a neurologist, Dr. Sandra Cockayne. She does have an appointment with her, but  not until 12/09/20. Zoe Martinez, Zoe Martinez, Zoe is due to run out of these medications before seeing Dr. Sandra Cockayne in November. Would you be able to refill these for her  until she gets established with neurology? [facility] [address] [place], [place],  [phone]. If you have any questions or concerns, I can be reached at [phone]. Thank you, Samara Deist

## 2020-12-07 MED ORDER — MEMANTINE 10 MG TAB
10 mg | ORAL_TABLET | ORAL | 0 refills | Status: DC
Start: 2020-12-07 — End: 2020-12-09

## 2020-12-09 ENCOUNTER — Encounter

## 2020-12-09 ENCOUNTER — Ambulatory Visit
Admit: 2020-12-09 | Payer: MEDICARE | Attending: Internal Medicine | Primary: Student in an Organized Health Care Education/Training Program

## 2020-12-09 DIAGNOSIS — F02B0 Dementia in other diseases classified elsewhere, moderate, without behavioral disturbance, psychotic disturbance, mood disturbance, and anxiety: Secondary | ICD-10-CM

## 2020-12-09 MED ORDER — MEMANTINE 10 MG TAB
10 mg | ORAL_TABLET | ORAL | 3 refills | Status: DC
Start: 2020-12-09 — End: 2021-02-16

## 2020-12-09 MED ORDER — DULOXETINE 20 MG CAP, DELAYED RELEASE
20 mg | ORAL_CAPSULE | Freq: Two times a day (BID) | ORAL | 1 refills | Status: DC
Start: 2020-12-09 — End: 2021-02-22

## 2020-12-09 MED ORDER — GABAPENTIN 300 MG CAP
300 mg | ORAL_CAPSULE | Freq: Three times a day (TID) | ORAL | 2 refills | Status: DC
Start: 2020-12-09 — End: 2021-02-08

## 2020-12-09 MED ORDER — DONEPEZIL 10 MG TAB
10 mg | ORAL_TABLET | Freq: Every evening | ORAL | 3 refills | Status: AC
Start: 2020-12-09 — End: 2021-02-22

## 2020-12-09 NOTE — Progress Notes (Signed)
Chief Complaint   Patient presents with    New Patient     Self referred for dementia      1. Have you been to the ER, urgent care clinic since your last visit?  Hospitalized since your last visit? No     2. Have you seen or consulted any other health care providers outside of the [facility] since your last visit?  Include any pap smears or colon screening. No

## 2020-12-09 NOTE — Progress Notes (Signed)
Neurology Note    Patient ID:  Zoe Martinez  [id number]  80 y.o.  11-21-1940      Date of Consultation:  December 09, 2020      Assessment and Plan:  80 year old female presenting to neurology for dementia on Aricept and Namenda.  Dementia most consistent with Alzheimer's disease.  I would like to have better characterization of her dementia so I will send a referral to neuropsychiatry, Dr. Corky Downs.  At this time, continue Aricept 10 mg at bedtime and Namenda 10 mg twice daily.  Patient has had a brain MRI without contrast done in [place] by prior neurologist and I have asked the family to send this report to [place] if possible.  Discussed the disease process, symptomatic treatment options, prognosis expected for most dementias, importance of goals of care conversation for future.  Discussed conservative options of lifestyle modification including Mediterranean diet and fish oil supplements as well as aerobic exercise.    I will obtain a B12, folate and MMA just to ensure that there is no reversible memory issues on top of dementia, that can be fixed.  TSH has been checked by PCP.    Problem was discussed at length with the patient.  We reviewed the results of the study in detail.  We also discussed prognosis and treatment options.  The patient had opportunity today to ask all questions, expressed understanding of the instructions provided, and agreed with the plan of treatment.    Follow up in 8 months.     I spent 60 minutes providing care to this patient with >50% of the time spent counseling.         History of Present Illness:   Zoe Martinez is a 80 y.o. female with history of memory issues consistent with dementia over the last few years presenting for neurological evaluation and establishing care with neurology.    Her son and husband are in the room and help provide the history.  Her husband states that he has noticed that her memory has become an issue approximately several years ago.  He notes that the  progression was very slow.  She has been more forgetful with short-term memory and tends to remember her childhood and teenage year memories.  She has forgotten very important life events such as the passing of people in her life such as her parents and still thinks that they are alive.  She will forget where she is at times and what year it is.  She will forget where she is put things.  She is able to dress herself, feed herself, clean the house.  She is unable to drive for the last 2 years.  She has not had issues with getting lost around the neighborhood.  Her son notes that she has "OCD" tendencies where she will perseverate on picking up leaves or pulling grass in the yard and do repetitive tasks such as cleaning the same spot repetitively without any purpose.  She does not cook on her own and has difficulty with putting a meal together.  She is able to shower and bathe herself.  She is okay with remembering faces but will forget people's relationship or names.  She used to manage the finances but over the last 2 years she is unable to do this.  Her husband has been taking over this responsibility.  She has not had any issues with walking or gait difficulty.  She has not had any falls or tremor.  No bradykinesia.  No personality changes or behavioral changes.  No irritability or agitation or violence.  No difficulty with sleeping or sleepwalking or reenactment of dreams.  No hallucinations, either auditory or visual.  A few months ago, her husband mentions that she had a delusion that there were babies or children in the house that were missing.  However the patient states that she just thought that and did not actually have any visual hallucination.    The patient herself has noticed that her memory has declined and she states that she feels like she is in a fog.  She is sad because she used to be very meticulous with balancing a checkbook and "numbers" but now she is unable to do that and that is upsetting to  her.    Of note, her and her husband recently moved from [place] to be closer to their son and his wife.  Their prior neurologist started her on Aricept and Namenda.  A brain MRI was done however I do not have any records to review.  Husband states that there was some atrophy.      Denies smoking tobacco, significant alcohol use or drug use.    Past Medical History:   Diagnosis Date    Arthritis     mostly in back    Dementia (HCC)     signs own paperwork    GERD (gastroesophageal reflux disease)     SBO (small bowel obstruction) (HCC)     Skin cancer         Past Surgical History:   Procedure Laterality Date    HX AFIB ABLATION      HX COLONOSCOPY      HX SMALL BOWEL RESECTION          No family history on file.     Social History     Tobacco Use    Smoking status: Never    Smokeless tobacco: Never   Substance Use Topics    Alcohol use: Not Currently        Allergies   Allergen Reactions    Penicillins Shortness of Breath    Flu Vaccine 2011-12(3 Yr+)(Pf) Angioedema     Neck swelling        Prior to Admission medications    Medication Sig Start Date End Date Taking? Authorizing Provider   memantine (NAMENDA) 10 mg tablet TAKE 1 TABLET BY MOUTH TWO TIMES A DAY. 12/06/20   Gillian Scarce, MD   DULoxetine (CYMBALTA) 20 mg capsule Take 1 Capsule by mouth daily. 11/16/20   Gillian Scarce, MD   gabapentin (NEURONTIN) 300 mg capsule Take 1 Capsule by mouth three (3) times daily. Max Daily Amount: 900 mg. 11/10/20   Gillian Scarce, MD   Omeprazole delayed release (PRILOSEC D/R) 20 mg tablet Take 20 mg by mouth daily.    Provider, Historical   donepeziL (ARICEPT) 10 mg tablet Take 10 mg by mouth nightly.    Provider, Historical   predniSONE (DELTASONE) 5 mg tablet Take 10 mg by mouth two (2) times a day.    Provider, Historical   buprenorphine (BUTRANS) 20 mcg/hour 1 Patch by TransDERmal route every seven (7) days. Indications: severe chronic pain with opioid tolerance    Provider, Historical    polyethylene glycol (MIRALAX) 17 gram packet Take 17 g by mouth daily. Indications: constipation    Provider, Historical   docusate sodium (COLACE) 100 mg capsule Take 100 mg by mouth two (2) times a day. Indications: constipation  Provider, Historical   multivitamin (ONE A DAY) tablet Take 1 Tablet by mouth daily.    Provider, Historical   acetaminophen (TYLENOL) 500 mg tablet Take 1,000 mg by mouth every six (6) hours as needed for Pain.    Provider, Historical   ibuprofen (MOTRIN) 200 mg tablet Take 600 mg by mouth every six (6) hours as needed for Pain.    Provider, Historical   famotidine (PEPCID) 20 mg tablet Take 20 mg by mouth two (2) times a day.    Provider, Historical       Review of Systems:    General, constitutional: negative  Eyes, vision: negative  Ears, nose, throat: negative  Cardiovascular, heart: negative  Respiratory: negative  Gastrointestinal: negative  Genitourinary: negative  Musculoskeletal: negative  Skin and integumentary: negative  Psychiatric: negative  Endocrine: negative  Neurological: negative, except for HPI  Hematologic/lymphatic: negative  Allergy/immunology: negative    [] Unable to obtain  ROS due to  [] mental status change  [] sedated   [] intubated    Objective:     There were no vitals taken for this visit.    Physical Exam:  General:  appears well nourished in no acute distress  Neck: no obvious deformity or masses  Lungs: comfortable on room air  Heart:  well-perfused   Lower extremity: no edema  Skin: intact    Neurological exam:  Awake, alert, oriented to person, place and ; knows the month and year  Attention and concentration were intact  Language was intact.  There was no aphasia.  Able to repeat phrases.  Speech: no dysarthria    MMSE: 9 out of 30    Cranial nerves:   II-XII were tested    PERRRLA  Visual fields were full to finger counting   EOMI, no evidence of nystagmus  Facial sensation:  normal and symmetric  Facial motor: normal and symmetric  Hearing  intact  SCM strength intact  Tongue: midline without fasciculations    Motor:   Tone normal in upper and lower extremities   No bradykinesia or cogwheeling rigidity.    Strength testing:   deltoid triceps biceps Wrist ext. Wrist flex. intrinsics Hip flex. Hip ext. Knee ext.  Knee flex Dorsi flex Plantar flex   Right 5 5 5 5 5 5 5  NT 5 5 5 5    Left 5 5 5 5 5 5 5  NT 5 5 5 5        Sensory:  Intact to LT throughout     Reflexes:    Right Left  Biceps  2 2  Triceps  2 2  Brachiorad. 2 2  Patella  2 2  Achilles 2 2    Plantar response:  flexor bilaterally    Cerebellar testing: Mild postural tremor no rest tremor finger/nose and rapid alternating movements were intact      Gait: steady.     Labs:     Lab Results   Component Value Date/Time    Hemoglobin A1c 6.0 (H) 11/03/2020 02:38 PM    Sodium 140 11/03/2020 02:38 PM    Potassium 4.6 11/03/2020 02:38 PM    Chloride 107 11/03/2020 02:38 PM    Glucose 107 (H) 11/03/2020 02:38 PM    BUN 27 (H) 11/03/2020 02:38 PM    Creatinine 1.27 (H) 11/03/2020 02:38 PM    Calcium 10.1 11/03/2020 02:38 PM    WBC 12.9 (H) 11/03/2020 02:38 PM    HCT 38.4 11/03/2020 02:38 PM    HGB 11.6  11/03/2020 02:38 PM    PLATELET 229 11/03/2020 02:38 PM       Imaging:    Results from Hospital Encounter encounter on 11/02/20    MRI LUMB SPINE WO CONT    Narrative  INDICATION:  Lumbar radiculopathy    EXAMINATION:  MRI LUMBAR SPINE without CONTRAST    COMPARISON: None    TECHNIQUE: MR imaging of the lumbar spine was performed with sagittal T1, T2,  STIR;  axial T1, T2.  NO CONTRAST ADMINISTERED    FINDINGS:    Moderate dextroscoliosis of the thoracolumbar spine. Grade 1 anterolisthesis of  L2 on L3. Mild to moderate multilevel degenerative disc disease. No evidence for  acute fracture. No abnormality of the distal spinal cord.    T12-L1: No central or neural foraminal stenosis. Mild to moderate facet  arthropathy    L1/2: Small disc bulge causing no central or neural foraminal stenosis. Mild  facet  arthropathy    L2/3: Anterolisthesis with disc osteophyte complex causing no significant  central stenosis. Narrowing of the left subarticular zone with moderate left and  no right neural foraminal stenosis    L3/4: Disc osteophyte complex, facet arthropathy, and ligamentum flavum  hypertrophy causing no significant central stenosis. Narrowing of the right  subarticular zone without any neural foraminal stenosis    L4/5: Disc osteophyte complex, facet arthropathy, and ligamentum flavum  hypertrophy causing no central stenosis. Right subarticular stenosis with mild  to moderate right and no left neural foraminal stenosis    L5/S1: No central or neural foraminal stenosis. Mild to moderate facet  arthropathy.    Impression  Mild to moderate degenerative changes most pronounced at L2-3 and L4-5.      No results found for this or any previous visit.             Patient Active Problem List   Diagnosis Code    Hyperlipidemia, unspecified hyperlipidemia type E78.5    Constipation, unspecified constipation type K59.00    Chronic pain syndrome G89.4    Dementia, unspecified dementia severity, unspecified dementia type, unspecified whether behavioral, psychotic, or mood disturbance or anxiety (HCC) F03.90    Polymyalgia rheumatica (HCC) M35.3                   Signed By:   Virgel Manifold, DO  Neurophysiology      December 09, 2020

## 2020-12-10 LAB — VITAMIN B12 & FOLATE
Folate: 34.2 ng/mL — ABNORMAL HIGH (ref 5.0–21.0)
Folate: 34.2 ng/mL — ABNORMAL HIGH (ref 5.0–21.0)
Vitamin B-12: 513 pg/mL (ref 193–986)
Vitamin B12: 513 pg/mL (ref 193–986)

## 2020-12-12 LAB — METHYLMALONIC ACID, SERUM: METHYLMALONIC ACID: 275 nmol/L (ref 0–378)

## 2020-12-12 LAB — METHYLMALONIC ACID: Methylmalonic acid: 275 nmol/L (ref 0–378)

## 2020-12-21 MED ORDER — PREDNISONE 5 MG TAB
5 mg | ORAL_TABLET | Freq: Every day | ORAL | 0 refills | Status: AC
Start: 2020-12-21 — End: 2021-01-18

## 2020-12-21 NOTE — Telephone Encounter (Signed)
Future Appointments:  Future Appointments   Date Time Provider Department Center   02/09/2021  2:00 PM Gillian Scarce, MD [facility] BS AMB   07/12/2021 10:00 AM Gomez Cleverly, MD [facility] BS AMB   08/16/2021  8:40 AM Virgel Manifold, DO [facility] BS AMB        Last Appointment With Me:  11/03/2020     Requested Prescriptions     Pending Prescriptions Disp Refills    predniSONE (DELTASONE) 5 mg tablet 30 Tablet 0     Sig: Take 2 Tablets by mouth daily.   ----- Message from Shauna Hugh sent at 12/21/2020  7:48 AM EST -----  Regarding: Refill of Prednisone 5 mg  Good morning, Dr. Francesca Jewett,     My name is Samara Deist, dtr-in-law of the patient Jeanne C. Sanderlin, DOB 2040/01/25.     In setting up her weekly meds, she only has 5 days of prednisone 5 mg left. The list of meds here on MyChart indicates she takes 5 mg twice a day; however, she only takes 5 mg once a day in the mornings and this has been her schedule for quite some time.     Would you please be able to assist with the refill of this medication? [facility] [place]      Thank you for your time,   Zoriah Pulice

## 2020-12-21 NOTE — Telephone Encounter (Signed)
-----  Message from [facility] sent at 12/21/2020  7:48 AM EST -----  Regarding: Refill of Prednisone 5 mg  Good morning, Dr. Francesca Jewett,     My name is Samara Deist, dtr-in-law of the patient Zoe Martinez, DOB 08/26/2040.     In setting up her weekly meds, she only has 5 days of prednisone 5 mg left. The list of meds here on MyChart indicates she takes 5 mg twice a day; however, she only takes 5 mg once a day in the mornings and this has been her schedule for quite some time.     Would you please be able to assist with the refill of this medication? [facility] [place] [address].      Thank you for your time,   Jahya Schnettler

## 2021-01-03 ENCOUNTER — Encounter
Attending: Student in an Organized Health Care Education/Training Program | Primary: Student in an Organized Health Care Education/Training Program

## 2021-01-18 MED ORDER — PREDNISONE 5 MG TAB
5 mg | ORAL_TABLET | Freq: Every day | ORAL | 0 refills | Status: AC
Start: 2021-01-18 — End: 2021-02-09

## 2021-01-18 NOTE — Telephone Encounter (Signed)
Telephone Encounter by Maryellen Pile at 01/18/21 3382                Author: Maryellen Pile  Service: --  Author Type: Technician       Filed: 01/18/21 0829  Encounter Date: 01/16/2021  Status: Signed          Editor: Maryellen Pile (Technician)          From: Elliot Gurney: Gillian Scarce, MDSent: 01/16/2021  6:03 PM ESTSubject: PrednisoneHello Dr. Francesca Jewett, This is Samara Deist, dtr-in-law of  Zoe Martinez. In filling her pill boxes for her weekly medicine, I noticed she is almost in need of another refill of Prednisone 5 mg. I noticed here in MyChart it has that she takes 2 tablets a day, but she only takes 1 tablet a day.That being said,  we'd like to request a refill for Prednisone 5 mg, 1 tablet a day, with a 90-day supply from [facility]. Thank you very much! [facility]

## 2021-01-18 NOTE — Telephone Encounter (Signed)
Future Appointments:  Future Appointments   Date Time Provider Department Center   02/09/2021  2:00 PM Gillian Scarce, MD [facility] BS AMB   07/12/2021 10:00 AM Gomez Cleverly, MD [facility] BS AMB   08/16/2021  8:40 AM Virgel Manifold, DO [facility] BS AMB        Last Appointment With Me:  11/03/2020     Requested Prescriptions     Pending Prescriptions Disp Refills    predniSONE (DELTASONE) 5 mg tablet 90 Tablet 0     Sig: Take 1 Tablet by mouth daily.

## 2021-01-26 ENCOUNTER — Encounter: Attending: Internal Medicine | Primary: Student in an Organized Health Care Education/Training Program

## 2021-02-01 ENCOUNTER — Encounter
Attending: Student in an Organized Health Care Education/Training Program | Primary: Student in an Organized Health Care Education/Training Program

## 2021-02-06 ENCOUNTER — Encounter

## 2021-02-08 MED ORDER — GABAPENTIN 300 MG CAP
300 mg | ORAL_CAPSULE | Freq: Three times a day (TID) | ORAL | 2 refills | Status: DC
Start: 2021-02-08 — End: 2021-02-22

## 2021-02-09 ENCOUNTER — Ambulatory Visit
Attending: Student in an Organized Health Care Education/Training Program | Primary: Student in an Organized Health Care Education/Training Program

## 2021-02-09 ENCOUNTER — Ambulatory Visit
Admit: 2021-02-09 | Payer: MEDICARE | Attending: Student in an Organized Health Care Education/Training Program | Primary: Student in an Organized Health Care Education/Training Program

## 2021-02-09 DIAGNOSIS — R1013 Epigastric pain: Secondary | ICD-10-CM

## 2021-02-09 MED ORDER — MELATONIN 3 MG CAP
3 mg | ORAL_CAPSULE | Freq: Every evening | ORAL | 2 refills | Status: DC
Start: 2021-02-09 — End: 2021-02-22

## 2021-02-09 MED ORDER — FAMOTIDINE 20 MG TAB
20 mg | ORAL_TABLET | Freq: Two times a day (BID) | ORAL | 0 refills | Status: DC
Start: 2021-02-09 — End: 2021-02-15

## 2021-02-09 NOTE — Progress Notes (Signed)
Progress Notes by Joseph Art, MD at 02/09/21 1400                Author: Joseph Art, MD  Service: --  Author Type: Physician       Filed: 02/09/21 2055  Encounter Date: 02/09/2021  Status: Signed          Editor: Joseph Art, MD (Physician)                    Good Help to Those in Need??   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    - following with Dr. Efraim Kaufmann   - planning to get neuropsych testing done   - has been having issues with sundowning; will start melatonin and encourage regular bedtime at same time as husband.      Chronic pain: neck and back pain due to herniated disc   R CTS: recently underwent repair   - working with Dr. Daryel November   - MRI of spine done yesterday   - currently on gabapentin 300 mg tid, buprenorphine patch, duloxetine 20 mg daily, ibuprofen 200 mg q6h prn      Polymyalgia rheumatica:    - patient and family unaware of the diagnosis but listed on the sheet they brought with them at initial appt. Pt has been on long prednisone taper with 5 mg dose for the last year.    - will discontinue prednisone     Hyperlipidemia:    - will plan to obtain lipid panel at next visit when fasting       Hypertension:   - not at goal but will try to avoid starting new medication in 81 yo pt with dementia   - reassess at next visit      GERD:   - improved with famotidine but recently sx have worsened off the medication, will refill it for now though sx may improve off of prednisone.      Constipation: miralax and colase      Pelvic organ prolapse/Cystocele: pessary placed      Hx of basal cell carcinoma/actinic keratosis: previously on tretinoin 0.025% cream.      Atrial fibrillation: s/p ablation at [facility] about 10  years ago.        Follow-up and Dispositions      ??  Return in about 6 months (around 08/09/2021).          - Medicare yearly exam and discussion of GOC         Joseph Art, MD        CC:          Chief Complaint       Patient presents with        ?  Follow-up             HPI:        Zoe Martinez is a 81 y.o. female who presents to follow up after establishing care.      Patient has been feeling well, she does not have any new complaints today.  Son, Wille Glaser, states that she has been having issues with confusion before bedtime. She called her son to come to the house last night because she was confused. She typically stays up later than her husband at night who is usually there to help re-orient  her when she gets confused.       Patient continuing to have back pain but being managed by Gu.       Taking 5 mg of pred daily, which she has been on for the last year since she left her previous PCP. She does not have any objections to stopping this medication.            ROS:     All 10 point review of systems negative except those mentioned in the HPI.            Past Medical History:          Active Ambulatory Problems            Diagnosis  Date Noted         ?  Hyperlipidemia, unspecified hyperlipidemia type  11/03/2020     ?  Constipation, unspecified constipation type  11/03/2020     ?  Chronic pain syndrome  11/03/2020     ?  Dementia, unspecified dementia severity, unspecified dementia type, unspecified whether behavioral, psychotic, or mood disturbance or anxiety ([place])  11/03/2020     ?  Polymyalgia rheumatica ([place])  11/03/2020     ?  Major depressive disorder, recurrent, mild  12/09/2020     ?  Major depressive disorder, recurrent, moderate  12/09/2020         ?  Major depressive disorder, recurrent, unspecified  12/09/2020          Resolved Ambulatory Problems            Diagnosis  Date Noted         ?  Routine physical examination  11/03/2020          Past Medical History:        Diagnosis  Date          ?  Arthritis       ?  Dementia ([place])       ?  GERD (gastroesophageal reflux disease)       ?  SBO (small bowel obstruction) ([place])           ?  Skin cancer                   Current Medications:        Current Outpatient Medications:    ?  famotidine (PEPCID) 20 mg tablet, Take 1 Tablet by mouth two (2) times a day., Disp: 30 Tablet, Rfl: 0   ?  melatonin 3 mg cap capsule, Take 1 Capsule by mouth nightly., Disp: 30 Capsule, Rfl: 2   ?  gabapentin (NEURONTIN) 300 mg capsule, TAKE 1 CAPSULE BY MOUTH THREE (3) TIMES DAILY. MAX DAILY AMOUNT: 900 MG., Disp: 90 Capsule, Rfl:  2   ?  donepeziL (ARICEPT) 10 mg tablet, Take 1 Tablet by mouth nightly., Disp: 30 Tablet, Rfl: 3   ?  memantine (NAMENDA) 10 mg tablet, TAKE 1 TABLET BY MOUTH TWO TIMES A DAY., Disp: 30 Tablet, Rfl: 3   ?  DULoxetine (CYMBALTA) 20 mg capsule, Take 1 Capsule by mouth two (2) times a day., Disp: 90 Capsule, Rfl: 1   ?  buprenorphine (BUTRANS) 20 mcg/hour, 1 Patch by  TransDERmal route every seven (7) days. Indications: severe chronic pain with opioid tolerance,  Disp: , Rfl:    ?  polyethylene glycol (MIRALAX) 17 gram packet, Take 17 g by mouth daily. Indications: constipation, Disp: , Rfl:    ?  docusate sodium (COLACE) 100 mg capsule, Take 100 mg by mouth two (2) times a day. Indications: constipation, Disp: , Rfl:    ?  multivitamin (ONE A DAY) tablet, Take 1 Tablet by mouth daily., Disp: , Rfl:    ?  acetaminophen (TYLENOL) 500 mg tablet, Take 1,000 mg by mouth every six (6) hours as needed for Pain., Disp: , Rfl:    ?  ibuprofen (MOTRIN) 200 mg tablet, Take 600 mg by mouth every six (6) hours as needed for Pain., Disp: , Rfl:            Past Surgical History:          Past Surgical History:         Procedure  Laterality  Date          ?  HX AFIB ABLATION         ?  HX COLONOSCOPY              ?  HX SMALL BOWEL RESECTION                    Family History:     No family history on file.           Social History:          Social History           Socioeconomic History         ?  Marital status:  MARRIED              Spouse name:  Not on file         ?  Number of children:  Not on file     ?  Years of education:  Not on file     ?  Highest education level:  Not on file       Occupational History        ?  Not on file       Tobacco Use         ?  Smoking status:  Never     ?  Smokeless tobacco:  Never       Vaping Use         ?  Vaping Use:  Never used       Substance and Sexual Activity         ?  Alcohol use:  Not Currently     ?  Drug use:  Not Currently     ?  Sexual activity:  Not on file        Other Topics  Concern        ?  Not on file       Social History Narrative        ?  Not on file          Social Determinants of Health          Financial Resource Strain: Low Risk         ?  Difficulty of Paying Living Expenses: Not hard at all       Food Insecurity: No Food Insecurity        ?  Worried About Charity fundraiser  in the Last Year: Never true     ?  Ran Out of Food in the Last Year: Never true       Transportation Needs: Not on file     Physical Activity: Not on file     Stress: Not on file     Social Connections: Not on file     Intimate Partner Violence: Not on file       Housing Stability: Not on file                     Visit Vitals      BP  (!) 153/57 (BP 1 Location: Right upper arm, BP Patient Position: Sitting, BP Cuff Size: Adult)     Pulse  66     Temp  97.5 ??F (36.4 ??C) (Temporal)     Resp  16     Ht  5' 2"  (1.575 m)     Wt  120 lb 12.8 oz (54.8 kg)     SpO2  95%        BMI  22.09 kg/m??             Physical Exam:     General - Well appearing female   HEENT - PERRL, TM no erythema/opacification, normal nasal turbinates, no oropharyngeal erythema or exudate, MMM   Neck - supple, no thyroidomegaly, no lymphadenopathy   Pulm - clear to auscultation bilaterally   Cardio - RRR, normal S1 S2, no murmur   Abd - soft, nontender, no masses, nabs   Extrem - no edema, +2 distal pulses   Neuro-  Alert difficulty with history and memory.         Lab  Results         Component  Value  Date/Time            WBC  12.9 (H)  11/03/2020 02:38 PM       HGB  11.6  11/03/2020 02:38 PM       HCT  38.4  11/03/2020 02:38 PM       PLATELET  229  11/03/2020 02:38 PM            MCV  101.3 (H)  11/03/2020 02:38 PM          Lab Results         Component  Value  Date/Time            Sodium  140  11/03/2020 02:38 PM       Potassium  4.6  11/03/2020 02:38 PM       Chloride  107  11/03/2020 02:38 PM       CO2  27  11/03/2020 02:38 PM       Anion gap  6  11/03/2020 02:38 PM       Glucose  107 (H)  11/03/2020 02:38 PM       BUN  27 (H)  11/03/2020 02:38 PM       Creatinine  1.27 (H)  11/03/2020 02:38 PM       BUN/Creatinine ratio  21 (H)  11/03/2020 02:38 PM       Calcium  10.1  11/03/2020 02:38 PM       Bilirubin, total  0.2  11/03/2020 02:38 PM       Alk. phosphatase  74  11/03/2020 02:38 PM       Protein, total  6.5  11/03/2020 02:38 PM       Albumin  3.9  11/03/2020 02:38 PM       Globulin  2.6  11/03/2020 02:38 PM       A-G Ratio  1.5  11/03/2020 02:38 PM       ALT (SGPT)  20  11/03/2020 02:38 PM            AST (SGOT)  16  11/03/2020 02:38 PM          Lab Results         Component  Value  Date/Time            Hemoglobin A1c  6.0 (H)  11/03/2020 02:38 PM          Lab Results         Component  Value  Date/Time            TSH  0.82  11/03/2020 02:38 PM        No results found for: CHOL, CHOLPOCT, CHOLX, CHLST, CHOLV, TOTCHOLEXT, HDL, HDLPOC, HDLEXT, HDLP, LDL, LDLCPOC, LDLCEXT, LDLC, DLDLP, VLDLC, VLDL, TGLX, TRIGL, TRIGLYCEXT, TRIGP, TGLPOCT, CHHD, CHHDX

## 2021-02-09 NOTE — Progress Notes (Signed)
 Chief Complaint   Patient presents with    Follow-up          1. Have you been to the ER, urgent care clinic since your last visit?  Hospitalized since your last visit?no    2. Have you seen or consulted any other health care providers outside of the [facility] since your last visit?no    3. For patients aged 81-75: Has the patient had a colonoscopy / FIT/ Cologuard? NA - based on age      If the patient is female:    4. For patients aged 68-74: Has the patient had a mammogram within the past 2 years? NA - based on age or sex      6. For patients aged 21-65: Has the patient had a pap smear? NA - based on age or sex

## 2021-02-14 ENCOUNTER — Encounter

## 2021-02-15 MED ORDER — FAMOTIDINE 20 MG TAB
20 mg | ORAL_TABLET | Freq: Two times a day (BID) | ORAL | 2 refills | Status: DC
Start: 2021-02-15 — End: 2021-02-22

## 2021-02-15 NOTE — Telephone Encounter (Signed)
PCP: Gillian Scarce, MD    Last appt: 02/09/2021  Future Appointments   Date Time Provider Department Center   02/18/2021  1:20 PM [facility] NURSE [facility] BS AMB   07/12/2021 10:00 AM Gomez Cleverly, MD [facility] BS AMB   08/12/2021  1:00 PM Gillian Scarce, MD [facility] BS AMB   08/16/2021  8:40 AM Ghaffari, Leila, DO [facility] BS AMB       Requested Prescriptions     Pending Prescriptions Disp Refills    famotidine (PEPCID) 20 mg tablet 60 Tablet 2     Sig: Take 1 Tablet by mouth two (2) times a day.

## 2021-02-15 NOTE — Telephone Encounter (Signed)
-----   Message from [facility] sent at 02/14/2021  5:24 PM EST -----  Regarding: Rx for famotidine 20 mg  Hello Dr. Francesca Jewett,     This is Samara Deist, dtr-in-law of Shauna Hugh, DOB Jun 15, 2040.     I'm writing to ask about the recent famotidine Rx. It's written for 1 tab twice daily but the # is 30, so this makes it a 15-day supply. It would be less expensive and easier to manage if we could get it for a 30-day supply or 60 tablets.    Could you please take a look and see if this would work with the [facility] drug program?      Thank you very much.     The [facility]

## 2021-02-16 MED ORDER — MEMANTINE 10 MG TAB
10 mg | ORAL_TABLET | ORAL | 3 refills | Status: DC
Start: 2021-02-16 — End: 2021-02-22

## 2021-02-18 ENCOUNTER — Institutional Professional Consult (permissible substitution): Admit: 2021-02-18 | Payer: MEDICARE | Primary: Student in an Organized Health Care Education/Training Program

## 2021-02-18 DIAGNOSIS — Z23 Encounter for immunization: Secondary | ICD-10-CM

## 2021-02-22 ENCOUNTER — Encounter

## 2021-02-22 NOTE — Telephone Encounter (Signed)
Zoe Martinez states pt has been having problems with [facility]. Would like new scripts for 90 DAY SUPPLIES to be sent to [facility].     Requested Prescriptions     Pending Prescriptions Disp Refills    donepeziL (ARICEPT) 10 mg tablet 30 Tablet 3     Sig: Take 1 Tablet by mouth nightly.    DULoxetine (CYMBALTA) 20 mg capsule 90 Capsule 1     Sig: Take 1 Capsule by mouth two (2) times a day.    memantine (NAMENDA) 10 mg tablet 60 Tablet 3     Sig: TAKE 1 TABLET BY MOUTH TWO TIMES A DAY.     Please make sure scripts are written to dispense 90 day supplies to [facility]

## 2021-02-22 NOTE — Telephone Encounter (Signed)
-----   Message from [place] sent at 02/22/2021 11:11 AM EST -----  Regarding: New Pharmacy for [facility]  Dr. Francesca Jewett & Staff -     The [place] family would like to change pharmacies for [name], DOB 2040-06-03, to [facility] mail order program (for those prescriptions that can be ordered through this pharmacy).    We would also like to request a 90-day supply for her medications, please.    Thank you so much for helping [place] with these changes.     The [facility]

## 2021-02-22 NOTE — Telephone Encounter (Signed)
Future Appointments:  Future Appointments   Date Time Provider Department Center   07/12/2021 10:00 AM Gomez Cleverly, MD [facility] BS AMB   08/12/2021  1:00 PM Gillian Scarce, MD [facility] BS AMB   08/16/2021  8:40 AM Virgel Manifold, DO [facility] BS AMB        Last Appointment With Me:  02/09/2021     Requested Prescriptions     Pending Prescriptions Disp Refills    gabapentin (NEURONTIN) 300 mg capsule 90 Capsule 2     Sig: Take 1 Capsule by mouth three (3) times daily. Max Daily Amount: 900 mg.    melatonin 3 mg cap capsule 30 Capsule 2     Sig: Take 1 Capsule by mouth nightly.    famotidine (PEPCID) 20 mg tablet 60 Tablet 2     Sig: Take 1 Tablet by mouth two (2) times a day.

## 2021-02-22 NOTE — Telephone Encounter (Signed)
Pt needs a PA for Memantine. Please make sure it goes to [facility] and not [facility].

## 2021-02-24 MED ORDER — MEMANTINE 10 MG TAB
10 mg | ORAL_TABLET | ORAL | 3 refills | Status: AC
Start: 2021-02-24 — End: 2021-04-29

## 2021-02-24 MED ORDER — DULOXETINE 20 MG CAP, DELAYED RELEASE
20 mg | ORAL_CAPSULE | Freq: Two times a day (BID) | ORAL | 1 refills | Status: DC
Start: 2021-02-24 — End: 2021-03-03

## 2021-02-24 MED ORDER — DONEPEZIL 10 MG TAB
10 mg | ORAL_TABLET | Freq: Every evening | ORAL | 3 refills | Status: DC
Start: 2021-02-24 — End: 2021-04-27

## 2021-02-24 NOTE — Telephone Encounter (Signed)
Last OV was 12/09/20 with you

## 2021-02-28 MED ORDER — MELATONIN 3 MG CAP
3 mg | ORAL_CAPSULE | Freq: Every evening | ORAL | 2 refills | Status: AC
Start: 2021-02-28 — End: ?

## 2021-02-28 MED ORDER — GABAPENTIN 300 MG CAP
300 mg | ORAL_CAPSULE | Freq: Three times a day (TID) | ORAL | 2 refills | Status: AC
Start: 2021-02-28 — End: 2021-05-24

## 2021-02-28 MED ORDER — FAMOTIDINE 20 MG TAB
20 mg | ORAL_TABLET | Freq: Two times a day (BID) | ORAL | 2 refills | Status: AC
Start: 2021-02-28 — End: ?

## 2021-03-03 MED ORDER — DULOXETINE 20 MG CAP, DELAYED RELEASE
20 mg | ORAL_CAPSULE | Freq: Two times a day (BID) | ORAL | 1 refills | Status: DC
Start: 2021-03-03 — End: 2021-04-19

## 2021-04-01 NOTE — Telephone Encounter (Signed)
 Memantine  PA sent to [facility] via CMM   Key: [id number] - PA Case ID: [id number]    Status is pending.

## 2021-04-05 NOTE — Telephone Encounter (Signed)
 Memantine   Key: [id number] - PA Case ID: [id number] - I sent the message below to [facility] Mail home delivery today and requested to please fill it.     Does not require a P.A. - reply from [facility] via CMM     N/A  on March 10  This medication or product is on your plan's list of covered drugs. Prior authorization is not required at this time. If your pharmacy has questions regarding the processing of your prescription, please have them call the [facility] pharmacy help desk at [phone]. **Please note: This request was submitted electronically.

## 2021-04-19 NOTE — Telephone Encounter (Signed)
 Needs to go to [facility]. Looks like the other pharmacy is in active ( see 03/03/21 refill)

## 2021-04-20 MED ORDER — DULOXETINE 20 MG CAP, DELAYED RELEASE
20 mg | ORAL_CAPSULE | Freq: Two times a day (BID) | ORAL | 1 refills | Status: AC
Start: 2021-04-20 — End: ?

## 2021-04-27 MED ORDER — DONEPEZIL 10 MG TAB
10 mg | ORAL_TABLET | Freq: Every evening | ORAL | 3 refills | Status: AC
Start: 2021-04-27 — End: ?

## 2021-04-27 NOTE — Telephone Encounter (Signed)
Received fax from [facility] requesting refill on aricept. Last OV was 11/1720 and next is scheduled for 08/16/21. Last filled 02/24/21 # 30 with 3 refills

## 2021-04-29 MED ORDER — MEMANTINE 10 MG TAB
10 mg | ORAL_TABLET | ORAL | 3 refills | Status: AC
Start: 2021-04-29 — End: ?

## 2021-04-29 NOTE — Telephone Encounter (Signed)
Received fax from [facility] requesting refill on Namenda. Last OV was 12/09/20  Last filled on 02/24/21 # 60 with 3 refills

## 2021-05-02 MED ORDER — FAMOTIDINE 20 MG TAB
20 mg | ORAL_TABLET | ORAL | 3 refills | Status: AC
Start: 2021-05-02 — End: ?

## 2021-05-17 NOTE — Telephone Encounter (Signed)
Telephone Encounter by Berneda Rose at 05/17/21 1349                Author: Berneda Rose  Service: --  Author Type: --       Filed: 05/17/21 1351  Encounter Date: 05/17/2021  Status: Signed          Editor: Berneda Rose               Called the pt and left her a vm letting her know that we will have to cancel her appt on 07/12/21, because our provider is leaving the practice and he will not be able to see her.  I asked her to give [place] a call back if she has any other questions.Marland Kitchen

## 2021-05-19 NOTE — Telephone Encounter (Signed)
PT's daughter left a VM stating that they received a call about canceling an appointment. I called back to inform that it was due to [facility] leaving but had to leave VM asking for them to CB. I provided the office number.

## 2021-05-24 DIAGNOSIS — T464X1A Poisoning by angiotensin-converting-enzyme inhibitors, accidental (unintentional), initial encounter: Secondary | ICD-10-CM

## 2021-05-24 DIAGNOSIS — R55 Syncope and collapse: Secondary | ICD-10-CM

## 2021-05-24 NOTE — Telephone Encounter (Signed)
Future Appointments:  Future Appointments   Date Time Provider Department Center   08/12/2021  1:00 PM Gillian Scarce, MD [facility] BS AMB        Last Appointment With Me:  02/09/2021     Requested Prescriptions     Pending Prescriptions Disp Refills    gabapentin (NEURONTIN) 300 mg capsule 90 Capsule 2     Sig: Take 1 Capsule by mouth three (3) times daily. Max Daily Amount: 900 mg.

## 2021-05-24 NOTE — ED Notes (Signed)
On phone with poison control

## 2021-05-24 NOTE — Telephone Encounter (Signed)
[facility] states pt's medication went out on 4-12 and delivered on 05-06-21    Pt states she never received this.    They will need one auth to replace it and a replacement script.  Please not in regards to order not received. Use order [id number]      Sending pt a 10 day supply until we send.

## 2021-05-24 NOTE — ED Notes (Signed)
Used Adult small BP cuff on pt for 2330 vitals, appropriate size for pt arm.

## 2021-05-25 ENCOUNTER — Inpatient Hospital Stay
Admit: 2021-05-25 | Discharge: 2021-05-26 | Disposition: A | Discharge status: Home / Self Care | Payer: MEDICARE | Attending: Student in an Organized Health Care Education/Training Program | Admitting: Student in an Organized Health Care Education/Training Program

## 2021-05-25 LAB — COMPREHENSIVE METABOLIC PANEL
ALT: 28 U/L (ref 12–78)
AST: 19 U/L (ref 15–37)
Albumin/Globulin Ratio: 1.1 (ref 1.1–2.2)
Albumin: 3.5 g/dL (ref 3.5–5.0)
Alkaline Phosphatase: 112 U/L (ref 45–117)
Anion Gap: 7 mmol/L (ref 5–15)
BUN: 29 MG/DL — ABNORMAL HIGH (ref 6–20)
Bun/Cre Ratio: 30 — ABNORMAL HIGH (ref 12–20)
CO2: 23 mmol/L (ref 21–32)
Calcium: 9.5 MG/DL (ref 8.5–10.1)
Chloride: 109 mmol/L — ABNORMAL HIGH (ref 97–108)
Creatinine: 0.96 MG/DL (ref 0.55–1.02)
ESTIMATED GLOMERULAR FILTRATION RATE: 59 mL/min/{1.73_m2} — ABNORMAL LOW (ref >60)
Globulin: 3.1 g/dL (ref 2.0–4.0)
Glucose: 164 mg/dL — ABNORMAL HIGH (ref 65–100)
Potassium: 3.4 mmol/L — ABNORMAL LOW (ref 3.5–5.1)
Sodium: 139 mmol/L (ref 136–145)
Total Bilirubin: 0.1 MG/DL — ABNORMAL LOW (ref 0.2–1.0)
Total Protein: 6.6 g/dL (ref 6.4–8.2)

## 2021-05-25 LAB — CBC WITH AUTO DIFFERENTIAL
Basophils %: 1 % (ref 0–1)
Basophils Absolute: 0.1 10*3/uL (ref 0.0–0.1)
Eosinophils %: 5 % (ref 0–7)
Eosinophils Absolute: 0.4 10*3/uL (ref 0.0–0.4)
Granulocyte Absolute Count: 0 10*3/uL (ref 0.00–0.04)
Hematocrit: 38.1 % (ref 35.0–47.0)
Hemoglobin: 11.8 g/dL (ref 11.5–16.0)
Immature Granulocytes: 0 % (ref 0.0–0.5)
Lymphocytes %: 26 % (ref 12–49)
Lymphocytes Absolute: 2 10*3/uL (ref 0.8–3.5)
MCH: 29 PG (ref 26.0–34.0)
MCHC: 31 g/dL (ref 30.0–36.5)
MCV: 93.6 FL (ref 80.0–99.0)
MPV: 11.2 FL (ref 8.9–12.9)
Monocytes %: 9 % (ref 5–13)
Monocytes Absolute: 0.7 10*3/uL (ref 0.0–1.0)
NRBC Absolute: 0 10*3/uL (ref 0.00–0.01)
Neutrophils %: 59 % (ref 32–75)
Neutrophils Absolute: 4.6 10*3/uL (ref 1.8–8.0)
Nucleated RBCs: 0 PER 100 WBC
Platelets: 217 10*3/uL (ref 150–400)
RBC: 4.07 M/uL (ref 3.80–5.20)
RDW: 12.6 % (ref 11.5–14.5)
WBC: 7.8 10*3/uL (ref 3.6–11.0)

## 2021-05-25 LAB — POC CHEM8
CHLORIDE ISTAT,ICL: 113 mmol/L — ABNORMAL HIGH (ref 98–107)
Calcium, ionized (POC): 1.21 mmol/L (ref 1.12–1.32)
Calcium, ionized, POC: 1.21 mmol/L (ref 1.12–1.32)
Chloride (POC): 113 mmol/L — ABNORMAL HIGH (ref 98–107)
Creatinine (POC): 0.81 mg/dL (ref 0.6–1.3)
GLUCOSE ISTAT,IGLU: 100 mg/dL (ref 65–100)
Glucose (POC): 100 mg/dL (ref 65–100)
POC Creatinine: 0.81 mg/dL (ref 0.6–1.3)
POTASSIUM ISTAT,IK: 2.9 mmol/L — ABNORMAL LOW (ref 3.5–5.1)
Potassium (POC): 2.9 mmol/L — ABNORMAL LOW (ref 3.5–5.1)
SODIUM ISTAT,INA: 145 mmol/L (ref 136–145)
Sodium (POC): 145 mmol/L (ref 136–145)
eGFR (POC): >60 mL/min/{1.73_m2} (ref >60)
eGFR, POC: >60 mL/min/{1.73_m2} (ref >60)

## 2021-05-25 LAB — TROPONIN, HIGH SENSITIVITY: Troponin, High Sensitivity: 8 ng/L (ref 0–51)

## 2021-05-25 LAB — CBC WITH AUTOMATED DIFF
ABS. BASOPHILS: 0.1 10*3/uL (ref 0.0–0.1)
ABS. EOSINOPHILS: 0.4 10*3/uL (ref 0.0–0.4)
ABS. IMM. GRANS.: 0 10*3/uL (ref 0.00–0.04)
ABS. LYMPHOCYTES: 2 10*3/uL (ref 0.8–3.5)
ABS. MONOCYTES: 0.7 10*3/uL (ref 0.0–1.0)
ABS. NEUTROPHILS: 4.6 10*3/uL (ref 1.8–8.0)
ABSOLUTE NRBC: 0 10*3/uL (ref 0.00–0.01)
BASOPHILS: 1 % (ref 0–1)
EOSINOPHILS: 5 % (ref 0–7)
HCT: 38.1 % (ref 35.0–47.0)
HGB: 11.8 g/dL (ref 11.5–16.0)
IMMATURE GRANULOCYTES: 0 % (ref 0.0–0.5)
LYMPHOCYTES: 26 % (ref 12–49)
MCH: 29 PG (ref 26.0–34.0)
MCHC: 31 g/dL (ref 30.0–36.5)
MCV: 93.6 FL (ref 80.0–99.0)
MONOCYTES: 9 % (ref 5–13)
MPV: 11.2 FL (ref 8.9–12.9)
NEUTROPHILS: 59 % (ref 32–75)
NRBC: 0 PER 100 WBC
PLATELET: 217 10*3/uL (ref 150–400)
RBC: 4.07 M/uL (ref 3.80–5.20)
RDW: 12.6 % (ref 11.5–14.5)
WBC: 7.8 10*3/uL (ref 3.6–11.0)

## 2021-05-25 LAB — EKG, 12 LEAD, INITIAL
Calculated P Axis: 71 degrees
QTC Calculation (Bezet): 453 ms
Ventricular Rate: 57 {beats}/min

## 2021-05-25 LAB — METABOLIC PANEL, COMPREHENSIVE
A-G Ratio: 1.1 (ref 1.1–2.2)
ALT (SGPT): 28 U/L (ref 12–78)
AST (SGOT): 19 U/L (ref 15–37)
Albumin: 3.5 g/dL (ref 3.5–5.0)
Alk. phosphatase: 112 U/L (ref 45–117)
Anion gap: 7 mmol/L (ref 5–15)
BUN/Creatinine ratio: 30 — ABNORMAL HIGH (ref 12–20)
BUN: 29 MG/DL — ABNORMAL HIGH (ref 6–20)
Bilirubin, total: 0.1 MG/DL — ABNORMAL LOW (ref 0.2–1.0)
CO2: 23 mmol/L (ref 21–32)
Calcium: 9.5 MG/DL (ref 8.5–10.1)
Chloride: 109 mmol/L — ABNORMAL HIGH (ref 97–108)
Creatinine: 0.96 MG/DL (ref 0.55–1.02)
Globulin: 3.1 g/dL (ref 2.0–4.0)
Glucose: 164 mg/dL — ABNORMAL HIGH (ref 65–100)
Potassium: 3.4 mmol/L — ABNORMAL LOW (ref 3.5–5.1)
Protein, total: 6.6 g/dL (ref 6.4–8.2)
Sodium: 139 mmol/L (ref 136–145)
eGFR: 59 mL/min/{1.73_m2} — ABNORMAL LOW (ref >60)

## 2021-05-25 LAB — TROPONIN-HIGH SENSITIVITY: Troponin-High Sensitivity: 8 ng/L (ref 0–51)

## 2021-05-25 MED ORDER — MELATONIN 3 MG TAB
3 mg | Freq: Every evening | ORAL | Status: AC | PRN
Start: 2021-05-25 — End: 2021-05-26

## 2021-05-25 MED ORDER — FAMOTIDINE 20 MG TAB
20 mg | Freq: Every day | ORAL | Status: DC
Start: 2021-05-25 — End: 2021-05-26
  Administered 2021-05-26: 13:00:00 via ORAL

## 2021-05-25 MED ORDER — DOCUSATE SODIUM 100 MG CAP
100 mg | Freq: Every day | ORAL | Status: AC
Start: 2021-05-25 — End: 2021-05-26
  Administered 2021-05-26: 13:00:00 via ORAL

## 2021-05-25 MED ORDER — SODIUM CHLORIDE 0.9 % IJ SYRG
Freq: Three times a day (TID) | INTRAMUSCULAR | Status: AC
Start: 2021-05-25 — End: 2021-05-26
  Administered 2021-05-25 – 2021-05-26 (×3): via INTRAVENOUS

## 2021-05-25 MED ORDER — SODIUM CHLORIDE 0.9% BOLUS IV
0.9 % | INTRAVENOUS | Status: AC
Start: 2021-05-25 — End: 2021-05-25
  Administered 2021-05-25: 03:00:00 via INTRAVENOUS

## 2021-05-25 MED ORDER — GABAPENTIN 300 MG CAP
300 mg | ORAL_CAPSULE | Freq: Three times a day (TID) | ORAL | 2 refills | Status: AC
Start: 2021-05-25 — End: ?

## 2021-05-25 MED ORDER — ENOXAPARIN 40 MG/0.4 ML SUB-Q SYRINGE
40 mg/0.4 mL | Freq: Every day | SUBCUTANEOUS | Status: AC
Start: 2021-05-25 — End: 2021-05-26
  Administered 2021-05-25 – 2021-05-26 (×2): via SUBCUTANEOUS

## 2021-05-25 MED ORDER — ONDANSETRON 4 MG TAB, RAPID DISSOLVE
4 mg | Freq: Three times a day (TID) | ORAL | Status: AC | PRN
Start: 2021-05-25 — End: 2021-05-26

## 2021-05-25 MED ORDER — ACETAMINOPHEN 325 MG TABLET
325 mg | Freq: Four times a day (QID) | ORAL | Status: AC | PRN
Start: 2021-05-25 — End: 2021-05-26
  Administered 2021-05-26: 13:00:00 via ORAL

## 2021-05-25 MED ORDER — POTASSIUM CHLORIDE SR 10 MEQ TAB
10 mEq | ORAL | Status: AC
Start: 2021-05-25 — End: 2021-05-25
  Administered 2021-05-25: 14:00:00 via ORAL

## 2021-05-25 MED ORDER — BUPRENORPHINE 20 MCG/HOUR WEEKLY TRANSDERMAL PATCH
20 mcg/hour | TRANSDERMAL | Status: DC
Start: 2021-05-25 — End: 2021-05-26

## 2021-05-25 MED ORDER — SODIUM CHLORIDE 0.9 % IJ SYRG
INTRAMUSCULAR | Status: DC | PRN
Start: 2021-05-25 — End: 2021-05-26

## 2021-05-25 MED ORDER — POTASSIUM CHLORIDE SR 10 MEQ TAB
10 mEq | ORAL | Status: AC
Start: 2021-05-25 — End: 2021-05-25
  Administered 2021-05-25: 19:00:00 via ORAL

## 2021-05-25 MED ORDER — SODIUM CHLORIDE 0.9 % IV
INTRAVENOUS | Status: AC
Start: 2021-05-25 — End: 2021-05-26
  Administered 2021-05-25: 13:00:00 via INTRAVENOUS

## 2021-05-25 MED ORDER — ONDANSETRON (PF) 4 MG/2 ML INJECTION
4 mg/2 mL | Freq: Four times a day (QID) | INTRAMUSCULAR | Status: AC | PRN
Start: 2021-05-25 — End: 2021-05-26

## 2021-05-25 MED ORDER — ACETAMINOPHEN 650 MG RECTAL SUPPOSITORY
650 mg | Freq: Four times a day (QID) | RECTAL | Status: DC | PRN
Start: 2021-05-25 — End: 2021-05-26

## 2021-05-25 MED ORDER — POLYETHYLENE GLYCOL 3350 17 GRAM (100 %) ORAL POWDER PACKET
17 gram | Freq: Every day | ORAL | Status: AC | PRN
Start: 2021-05-25 — End: 2021-05-26

## 2021-05-25 MED ORDER — SODIUM CHLORIDE 0.9% BOLUS IV
0.9 % | Freq: Once | INTRAVENOUS | Status: AC
Start: 2021-05-25 — End: 2021-05-25
  Administered 2021-05-25: 05:00:00 via INTRAVENOUS

## 2021-05-25 MED FILL — BD POSIFLUSH NORMAL SALINE 0.9 % INJECTION SYRINGE: INTRAMUSCULAR | Qty: 40

## 2021-05-25 MED FILL — SODIUM CHLORIDE 0.9 % IV: INTRAVENOUS | Qty: 1000

## 2021-05-25 MED FILL — SODIUM CHLORIDE 0.9 % IV: INTRAVENOUS | Qty: 500

## 2021-05-25 MED FILL — POTASSIUM CHLORIDE SR 10 MEQ TAB: 10 mEq | ORAL | Qty: 4

## 2021-05-25 MED FILL — ENOXAPARIN 40 MG/0.4 ML SUB-Q SYRINGE: 40 mg/0.4 mL | SUBCUTANEOUS | Qty: 0.4

## 2021-05-25 MED FILL — POTASSIUM CHLORIDE SR 10 MEQ TAB: 10 mEq | ORAL | Qty: 1

## 2021-05-25 NOTE — ED Notes (Signed)
eTRANSFER SBAR NOTE    IP UNIT CALLED NOTE IS READY: No  Spoke to (receiving unit sec, tech or Nurse) Herbert Seta    IF there are questions Call transferring nurse (your name) Tresa Endo at phone # [phone]    SITUATION/BACKGROUND:    Patient is being transferred to [facility], Room# 2307    Patient's Chief Complaint on arrival to ED was accidental overdose and is admitted for bradycardia, syncope, hypotension and accidental overdose.     CODE STATUS: No Order    VITAL SIGNS (MUST BE WITHIN 1 HOUR OF TRANSFER TO IP UNIT:    TEMP: 97.6  PULSE: 55  RESP: 15  BP: 121/53  PAIN SCORE: 0  MEWS: 2     ISOLATION/PRECAUTIONS: No     Called outstanding consults: No      Are there sign and held orders that need to be released? No   Are all STAT orders completed: Yes    STAT labs collected: Yes  REPEAT LACTIC ACID DUE? No     PHARMACY NEEDS:  Are there any titrating drips? No     Are there STAT meds  that need to be given? No    The following personal items will be sent with the patient during transfer to the floor:  All valuables:   ITEM:    ITEM: Visual Aid: None  ITEM:           ASSESSMENT:    CIWA Assessment: No     NEURO:   NIH SCORE:        YALE SCREENING:          NEURO ASSESSMENT:        If a Stroke Case ... When are the next V/S, nuero, NIH due? shift    Is patient impulsive? No   Is patient oriented? Dementia hx  Do they follow commands? Yes  Is the patient ambulatory? Yes Device need 1 person assist    FALL RISK? Yes   Is the Kinder 1 Assessment completed? Yes  INTERVENTIONS: 1 person assist    INTEGUMENTARY:   IS THE PATIENT UNDRESSED? Yes  WOUNDS PRESENT? No  On admit to ED No     RESPIRATORY:   Is patient on Oxygen? No      CARDIAC:   Is cardiac monitoring ordered? Yes Last Rhythm: sinus brady  Patient to transfer with tele box on? Yes    LINE ACCESS:   Peripheral IV 05/24/21 Left Antecubital (Active)   Site Assessment Clean, dry, & intact 05/24/21 2253   Phlebitis Assessment 0 05/24/21 2253   Infiltration  Assessment 0 05/24/21 2253   Dressing Status Clean, dry, & intact 05/24/21 2253   Dressing Type Transparent 05/24/21 2253   Hub Color/Line Status Pink 05/24/21 2253   Action Taken Blood drawn 05/24/21 2253        GU/GI:   CONTINENT BOWEL/BLADDER? Yes  URINARY OUTPUT: voiding   Written Order for Foley Cath? No       RESTRAINTS IN USE: No      IS DOCUMENTATION COMPLETE: No     Additional details as Needed:

## 2021-05-25 NOTE — Progress Notes (Signed)
Progress Notes by Arminda Resides, RN at 05/25/21 0330                Author: Arminda Resides, RN  Service: --  Author Type: Registered Nurse       Filed: 05/25/21 0603  Date of Service: 05/25/21 0330  Status: Signed          Editor: Arminda Resides, RN (Registered Nurse)               0330: Patient arrived to PCU 2307.       0400: RN sent message to Wilburt Finlay, NP regarding: Just wanted to put it on your radar: this patient came up from the ED about an hour ago, Nolon Bussing admited her, but she does not technically have a code status listed. My charge nurse said by default  she is automatically full code but just to let you know there is no official status listed. Thanks! Provider stated that it should show up once Dr. Nolon Bussing puts in admission orders.      Bedside shift change report given to Rhona, Charity fundraiser (Cabin crew) by Herbert Seta, RN (offgoing nurse). Report included the following information SBAR, Kardex, Intake/Output, MAR, Recent Results, and Cardiac Rhythm SB .

## 2021-05-25 NOTE — Progress Notes (Signed)
Progress Notes by Arminda Resides, RN at 05/25/21 0330                Author: Arminda Resides, RN  Service: --  Author Type: Registered Nurse       Filed: 05/25/21 0448  Date of Service: 05/25/21 0330  Status: Signed          Editor: Arminda Resides, RN (Registered Nurse)               TRANSFER - IN REPORT:      Verbal report received from E transfer report (name) on Zoe Martinez  being received from ED(unit) for routine progression of care        Report consisted of patients Situation, Background, Assessment and    Recommendations(SBAR).       Information from the following report(s) SBAR, Kardex, Intake/Output, MAR, Recent Results, and Cardiac Rhythm SB  was reviewed with the receiving nurse.      Opportunity for questions and clarification was provided.        Assessment completed upon patients arrival to unit and care assumed.

## 2021-05-25 NOTE — Progress Notes (Signed)
Problem: Falls - Risk of  Goal: *Absence of Falls  Description: Document Schmid Fall Risk and appropriate interventions in the flowsheet.  Outcome: Progressing Towards Goal  Note: Fall Risk Interventions:

## 2021-05-25 NOTE — ED Provider Notes (Signed)
ED Provider Notes by O'Bier, Makenleigh Crownover N, MD at 05/25/21 0127                Author: O'Bier, Eri Mcevers N, MD  Service: Emergency Medicine  Author Type: Physician       Filed: 05/25/21 0137  Date of Service: 05/25/21 0127  Status: Signed          Editor: O'Bier, Tanaysia Bhardwaj N, MD (Physician)               [facility] EMERGENCY DEPT  EMERGENCY DEPARTMENT ENCOUNTER            Pt Name: Zoe Martinez   MRN: [id number]   [place] [birth year]   Date of evaluation: 05/24/2021   Provider: Bronwyn Belasco Jonelle Sidle, MD    PCP: Joseph Art, MD   Note Started: 1:27 AM 05/25/21         CHIEF COMPLAINT            Chief Complaint       Patient presents with        ?  Drug Overdose             Patient arrives via EMS after accidentally taking one each of husbands Lisinopril and Metoprolol around 3-4pm. Patient reports feeling dizzy, some family  members told EMS staff that they witnessed a syncopal episode. Patient with hx of dementia and is poor historian.               HISTORY OF PRESENT ILLNESS: 1 or more elements         History From: Patient, EMS, Patient's Husband, and Patient's Son   HPI Limitations : Dementia       Zoe Martinez is a 81 y.o. female with history of arthritis, dementia, GERD who presents to ED for multiple syncopal events at home  After she accidentally took the wrong medication this evening.  Husband reports that his pillbox was filled and on the  table and he found patient sitting next to his empty pillbox.  She had taken all of his evening medications including acyclovir 200 mg, KCl 20 mEq, lisinopril 10 mg, metoprolol 12.47mlligrams, aspirin 81 mg.  He does not keep a close eye on her.  At about  7 PM he gave her her usual nighttime medications which include duloxetine 20 mg, donezepil 10 mg, Pepcid 20 mg, buprenorphine patch 15 mg/h.  Later, patient is in the kitchen and started to wobble.  Her husband is with her to help her and she passed out.   She did not fall but was assisted to the ground slowly.  Husband called his son  who came over to help and patient had another syncopal event.  At that point EMS was called.  Blood pressure noted to be low (systolics of 1921).  Patient denies pain.  There  was no reported injury.  She drank half a bottle of water prior to coming to ED.     REVIEW OF SYSTEMS         Review of Systems       Positives and Pertinent negatives as per HPI.        PAST HISTORY        Past Medical History:     Past Medical History:        Diagnosis  Date         ?  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  Dementia ([place])            signs own paperwork         ?  GERD (gastroesophageal reflux disease)       ?  SBO (small bowel obstruction) ([place])           ?  Skin cancer             Past Surgical History:     Past Surgical History:         Procedure  Laterality  Date          ?  HX AFIB ABLATION         ?  HX COLONOSCOPY              ?  HX SMALL BOWEL RESECTION               Family History:   No family history on file.      Social History:     Social History          Tobacco Use         ?  Smoking status:  Never     ?  Smokeless tobacco:  Never       Vaping Use         ?  Vaping Use:  Never used       Substance Use Topics         ?  Alcohol use:  Not Currently         ?  Drug use:  Not Currently           Allergies:     Allergies        Allergen  Reactions         ?  Penicillins  Shortness of Breath     ?  Flu Vaccine 2011-12(3 Yr+)(Pf)  Angioedema             Neck swelling             CURRENT MEDICATIONS           Previous Medications           ACETAMINOPHEN (TYLENOL) 500 MG TABLET     Take 1,000 mg by mouth every six (6) hours as needed for Pain.           BUPRENORPHINE (BUTRANS) 20 MCG/HOUR     1 Patch by TransDERmal route every seven (7) days. Indications: severe chronic pain with opioid tolerance           DOCUSATE SODIUM (COLACE) 100 MG CAPSULE     Take 100 mg by mouth two (2) times a day. Indications: constipation           DONEPEZIL (ARICEPT) 10 MG TABLET     Take 1 Tablet by mouth nightly.            DULOXETINE (CYMBALTA) 20 MG CAPSULE     Take 1 Capsule by mouth two (2) times a day.           FAMOTIDINE (PEPCID) 20 MG TABLET     TAKE 1 TABLET BY MOUTH TWICE  DAILY           GABAPENTIN (NEURONTIN) 300 MG CAPSULE     Take 1 Capsule by mouth three (3) times daily. Max Daily Amount: 900 mg.           IBUPROFEN (MOTRIN) 200 MG TABLET     Take 600 mg by mouth  every six (6) hours as needed for Pain.           MELATONIN 3 MG CAP CAPSULE     Take 1 Capsule by mouth nightly.           MEMANTINE (NAMENDA) 10 MG TABLET     TAKE 1 TABLET BY MOUTH TWO TIMES A DAY.           MULTIVITAMIN (ONE A DAY) TABLET     Take 1 Tablet by mouth daily.           POLYETHYLENE GLYCOL (MIRALAX) 17 GRAM PACKET     Take 17 g by mouth daily. Indications: constipation             PHYSICAL EXAM           ED Triage Vitals [05/24/21 2248]     ED Encounter Vitals Group           BP  99/78        Pulse (Heart Rate)  68        Resp Rate  17        Temp  97.6 F (36.4 C)        Temp src          O2 Sat (%)  99 %        Weight             Height              Physical Exam   Vitals and nursing note reviewed.    Constitutional:        General: She is not in acute distress.   HENT:       Head: Normocephalic and atraumatic.       Mouth/Throat:       Mouth: Mucous membranes are moist.    Eyes:       Pupils: Pupils are equal, round, and reactive to light.     Cardiovascular:       Rate and Rhythm: Regular rhythm. Bradycardia present.       Pulses: Normal pulses.    Pulmonary:       Effort: Pulmonary effort is normal.       Breath sounds: No rales.     Abdominal:       Palpations: Abdomen is soft.       Tenderness: There is no abdominal tenderness.     Musculoskeletal:          General: No tenderness. Normal range of motion.    Skin:      General: Skin is warm.       Capillary Refill: Capillary refill takes less than 2 seconds.       Findings: No bruising.    Neurological:       General: No focal deficit present.       Mental Status: She is alert.        Motor: No weakness.       Comments: Oriented x2    Psychiatric:          Mood and Affect: Mood normal.                DIAGNOSTIC RESULTS     LABS:         Recent Results (from the past 12 hour(s))     CBC WITH AUTOMATED DIFF          Collection Time: 05/24/21 10:57 PM  Result  Value  Ref Range            WBC  7.8  3.6 - 11.0 K/uL       RBC  4.07  3.80 - 5.20 M/uL       HGB  11.8  11.5 - 16.0 g/dL       HCT  38.1  35.0 - 47.0 %       MCV  93.6  80.0 - 99.0 FL       MCH  29.0  26.0 - 34.0 PG       MCHC  31.0  30.0 - 36.5 g/dL       RDW  12.6  11.5 - 14.5 %       PLATELET  217  150 - 400 K/uL       MPV  11.2  8.9 - 12.9 FL       NRBC  0.0  0 PER 100 WBC       ABSOLUTE NRBC  0.00  0.00 - 0.01 K/uL       NEUTROPHILS  59  32 - 75 %       LYMPHOCYTES  26  12 - 49 %       MONOCYTES  9  5 - 13 %       EOSINOPHILS  5  0 - 7 %       BASOPHILS  1  0 - 1 %       IMMATURE GRANULOCYTES  0  0.0 - 0.5 %       ABS. NEUTROPHILS  4.6  1.8 - 8.0 K/UL       ABS. LYMPHOCYTES  2.0  0.8 - 3.5 K/UL       ABS. MONOCYTES  0.7  0.0 - 1.0 K/UL       ABS. EOSINOPHILS  0.4  0.0 - 0.4 K/UL       ABS. BASOPHILS  0.1  0.0 - 0.1 K/UL       ABS. IMM. GRANS.  0.0  0.00 - 0.04 K/UL       DF  AUTOMATED          METABOLIC PANEL, COMPREHENSIVE          Collection Time: 05/24/21 10:57 PM         Result  Value  Ref Range            Sodium  139  136 - 145 mmol/L       Potassium  3.4 (L)  3.5 - 5.1 mmol/L       Chloride  109 (H)  97 - 108 mmol/L       CO2  23  21 - 32 mmol/L       Anion gap  7  5 - 15 mmol/L       Glucose  164 (H)  65 - 100 mg/dL       BUN  29 (H)  6 - 20 MG/DL       Creatinine  0.96  0.55 - 1.02 MG/DL       BUN/Creatinine ratio  30 (H)  12 - 20         eGFR  59 (L)  >60 ml/min/1.57m       Calcium  9.5  8.5 - 10.1 MG/DL       Bilirubin, total  0.1 (L)  0.2 - 1.0 MG/DL       ALT (SGPT)  28  12 -  78 U/L       AST (SGOT)  19  15 - 37 U/L       Alk. phosphatase  112  45 - 117 U/L       Protein, total  6.6  6.4 - 8.2 g/dL       Albumin  3.5   3.5 - 5.0 g/dL       Globulin  3.1  2.0 - 4.0 g/dL       A-G Ratio  1.1  1.1 - 2.2         EKG, 12 LEAD, INITIAL          Collection Time: 05/24/21 11:03 PM         Result  Value  Ref Range            Ventricular Rate  57  BPM       Atrial Rate  57  BPM       P-R Interval  146  ms       QRS Duration  84  ms       Q-T Interval  466  ms       QTC Calculation (Bezet)  453  ms       Calculated P Axis  71  degrees       Calculated R Axis  70  degrees       Calculated T Axis  83  degrees       Diagnosis                 ** Poor data quality, interpretation may be adversely affected   Sinus bradycardia   No previous ECGs available          POC CHEM8          Collection Time: 05/24/21 11:43 PM         Result  Value  Ref Range            Calcium, ionized (POC)  1.21  1.12 - 1.32 mmol/L       Sodium (POC)  145  136 - 145 mmol/L       Potassium (POC)  2.9 (L)  3.5 - 5.1 mmol/L       Chloride (POC)  113 (H)  98 - 107 mmol/L       Glucose (POC)  100  65 - 100 mg/dL       Creatinine (POC)  0.81  0.6 - 1.3 mg/dL       eGFR (POC)  >60  >60 ml/min/1.57m            Comment  Comment Not Indicated.               EKG at 11:03 PM on 05/24/2021 entered by me: Sinus tachycardia, 57 bpm, normal axis, normal PR, QRS, QTc interval, no ischemic changes                   EMERGENCY DEPARTMENT COURSE and DIFFERENTIAL DIAGNOSIS/MDM     Vitals:       Vitals:             05/25/21 0040  05/25/21 0054  05/25/21 0100  05/25/21 0110           BP:  (!) 108/46    (!) 109/46  (!) 118/52     Pulse:  (!) 51  (!) 59  (!) 51  64     Resp:  14  15  16  14     Temp:                   SpO2:  98%  98%                Patient was given the following medications:     Medications       sodium chloride 0.9 % bolus infusion 500 mL (500 mL IntraVENous New Bag 05/25/21 0103)       sodium chloride 0.9 % bolus infusion 1,000 mL (0 mL IntraVENous IV Completed 05/25/21 0035)                          CC/HPI Summary, DDx, ED Course, and Reassessment:    Patient presents to the ED  after multiple syncopal events at home.  She took her husband's blood pressure medications tonight.  Patient does not have high blood pressure.  Discussed with poison center who recommends admission for observation and IV fluids.   Will obtain labs including CBC, CMP, troponin.  Will treat with IV fluids.        ED Course as of 05/25/21 0127       Wed May 25, 2021        0048  Patient with bradycardia and hypotension after IV fluids.  Heart rate is 51.  Blood pressure 108/46.  Poison center recommends admission.  [AO]              ED Course User Index   [AO] O'Bier, Iridiana Fonner N, MD                   FINAL IMPRESSION           1.  Syncope and collapse      2.  Bradycardia      3.  Hypertension, unspecified type         4.  Accidental medication error, initial encounter                DISPOSITION/PLAN                  PATIENT REFERRED TO:     Follow-up Information      None                  DISCHARGE MEDICATIONS:     Current Discharge Medication List                    DISCONTINUED MEDICATIONS:     Current Discharge Medication List                  I am the Primary Clinician of Record.    Sonakshi Rolland Jonelle Sidle, MD (electronically signed)      (Please note that parts of this dictation were completed with voice recognition software. Quite often unanticipated grammatical, syntax, homophones, and other interpretive errors are inadvertently transcribed by the computer software. Please disregards  these errors. Please excuse any errors that have escaped final proofreading.)

## 2021-05-25 NOTE — Progress Notes (Signed)
Transition of Care Plan:    RUR: 7% "low risk"  Disposition: Home with family and follow up appts  If SNF or IPR: Date FOC offered: N/A  Date FOC received: N/A  Date authorization started with reference number: N/A  Date authorization received and expires: N/A  Accepting facility: N/A  Follow up appointments: PCP & Specialists as needed  DME needed: None at home  Transportation at Discharge: Pt's son or spouse will provide transport at d/c  Keys or means to access home: Pt has access to home     IM Medicare Letter: 2nd IM letter needed at d/c  Is patient a Veteran and connected with the [place]? N/A   Caregiver Contact: Pt's Spouse Janique Hoefer [phone]  Discharge Caregiver contacted prior to discharge? To be contacted  Care Conference needed?: Not at this time    Initial note: Chart reviewed for updates. CM met with pt, spouse Shaylen Nephew [phone] and son at bedside introduced role, confirmed demographics were up-to-date and discussed d/c planning.  Pt lives with her spouse in a 1 level home with 3 steps before you get to the front door and 7 steps at the back door. Pt's spouse and children are her support system. Pt is independent with ADL's athome and does not use any DME at home. Pt has a history of HH in the past. Pt denied history of SNF/IPR and OP rehab. Pt uses [facility] and [facility]. No PT/OT consults placed at this time. Pt's spouse/son will provide transport at d/c.    Reason for Admission:  Respiratory failure                  RUR Score: 7% "low risk"                     Plan for utilizing home health: Pt is independent with ADL's athome and does not use any DME at home. Pt has a history of HH in the past.          PCP: First and Last name:  Joseph Art, MD     Name of Practice: [facility]   Are you a current patient: Yes/No: Yes   Approximate date of last visit: January    Can you participate in a virtual visit with your PCP: In-person                    Current Advanced  Directive/Advance Care Plan: Full Code-ACP in file    Healthcare Decision Maker:              Primary Decision Maker: Melbeta - [phone]    Primary Decision Maker: Minna, Dum[place] [phone]                  Care Management Interventions  PCP Verified by CM: Yes  Palliative Care Criteria Met (RRAT>21 & CHF Dx)?: No  Mode of Transport at Discharge: Other (see comment) (Spouse/son will provide transport at d/c)  Transition of Care Consult (CM Consult): Discharge Planning  Discharge Durable Medical Equipment: No  Physical Therapy Consult: No  Occupational Therapy Consult: No  Speech Therapy Consult: No  Support Systems: Spouse/Significant Other, Child(ren) (Pt's spouse and children are her support system)  Confirm Follow Up Transport: Family (Spouse/son will provide transport at d/c)  The Plan for Transition of Care is Related to the Following Treatment Goals : Pt will d/c home with famiy and follow up appts  Discharge Location  Patient Expects to be Discharged to:: Home with family assistance (Pt will d/c home with famiy and follow up appts)     Christene Lye, MSW  Case Manager  [phone]

## 2021-05-25 NOTE — Telephone Encounter (Signed)
Gretchen from [facility] Caller:    Regarding medication:  gabapentin    Problem:  States pt reports med never delivered, pharm states they show delivered on 4/14 but states shows to an "agent" which doesn't sound normal and states an investigation is underway  States in mean time they need to send replacement and wants to use remaining refill as that replacement  Needs verbal to do so, please call          Callback number:  [phone]  Ref number   [id number]

## 2021-05-25 NOTE — ED Notes (Signed)
Pt alert, resting in bed, eyes open, resp even and unlabored, md aware of current vitals.

## 2021-05-25 NOTE — ED Notes (Signed)
Pt took her husbands medication, which included:  Lisinopril 10mg   metoprolal 12.5 mg (split from a 25 mg)  Acyclovir 200 mg  Potassium chloride 20 meq    As well as her medication her medication:  Duloxetine hcl 20 mg  memantine 10mg   Donepezil hcl 10 mg  Melatonin 3 mg  Buprenorphine 15 mcg/hr patch  Famotidine 20 mg

## 2021-05-25 NOTE — Telephone Encounter (Signed)
Telephone Encounter by Pamalee Leyden at 05/25/21 1518                Author: Pamalee Leyden  Service: --  Author Type: --       Filed: 05/25/21 1518  Encounter Date: 05/24/2021  Status: Signed          Editor: Pamalee Leyden               Future Appointments:     Future Appointments           Date  Time  Provider  Department  Center           08/12/2021   1:00 PM  Gillian Scarce, MD  [facility]  [facility]            Last Appointment With Me:   02/09/2021         Requested Prescriptions            Pending Prescriptions  Disp  Refills          ?  gabapentin (NEURONTIN) 300 mg capsule  90 Capsule  2             Sig: Take 1 Capsule by mouth three (3) times daily. Max Daily Amount: 900 mg.

## 2021-05-25 NOTE — H&P (Signed)
H&P by Hillard Danker, MD at 05/25/21 1339                Author: Hillard Danker, MD  Service: Internal Medicine  Author Type: Physician       Filed: 05/25/21 1357  Date of Service: 05/25/21 1339  Status: Signed          Editor: Hillard Danker, MD (Physician)                                  Hospitalist Admission Note      NAME: Zoe Martinez    DOB:  March 25, 1940    MRN:  [id number]       Date/Time:  05/25/2021 1:39 PM      Patient PCP: Joseph Art, MD   ______________________________________________________________________   Given the patient's current clinical presentation, I have a high level of concern for decompensation if discharged from the emergency department.  Complex decision making was performed, which includes  reviewing the patient's available past medical records, laboratory results, and x-ray films.         My assessment of this patient's clinical condition and my plan of care is as follows.      Assessment / Plan:     Drug overdose    Multiple syncopal episode possibly secondary hypertension   Accidental drug overdose      Admit patient to telemetry   Medical consultation regular   Hold lisinopril metoprolol   IV fluid   Check orthostatic vital      Hypertension   Hold antihypertensive medication blood pressure low      Dementia   Continue Aricept and namenda       GERD   Continue Pepcid         Hypokalemia   Replace follow-up repeat               Code Status: Full   Surrogate Decision Maker:      DVT Prophylaxis: Lovenox   GI Prophylaxis: not indicated      Baseline:            Subjective:     CHIEF COMPLAINT: Syncope      HISTORY OF PRESENT ILLNESS:      81 year old female currently on treatment for arthritis, dementia, GERD, hypertension presented to the hospital for evaluation of mental syncopal episode, patient was found by her husband sitting next to empty pillbox, patient accidentally take extra  lisinopril and metoprolol, patient poor history   Unable to provide history, most  of the history was obtained from the documentation, patient was noted to be hypotensive, started with IV fluid, blood work was done show no significant acute abnormality.      We were asked to admit for work up and evaluation of the above problems.         Past Medical History:        Diagnosis  Date         ?  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  Dementia ([place])            signs own paperwork         ?  GERD (gastroesophageal reflux disease)       ?  SBO (small bowel obstruction) ([place])           ?  Skin cancer                Past Surgical History:         Procedure  Laterality  Date          ?  HX AFIB ABLATION         ?  HX COLONOSCOPY              ?  HX SMALL BOWEL RESECTION                 Social History          Tobacco Use         ?  Smoking status:  Never     ?  Smokeless tobacco:  Never       Substance Use Topics         ?  Alcohol use:  Not Currently            No family history on file.     Allergies        Allergen  Reactions         ?  Penicillins  Shortness of Breath     ?  Flu Vaccine 2011-12(3 Yr+)(Pf)  Angioedema             Neck swelling              Prior to Admission medications             Medication  Sig  Start Date  End Date  Taking?  Authorizing Provider            famotidine (PEPCID) 20 mg tablet  TAKE 1 TABLET BY MOUTH TWICE  DAILY  05/02/21      Joseph Art, MD            memantine (NAMENDA) 10 mg tablet  TAKE 1 TABLET BY MOUTH TWO TIMES A DAY.  04/29/21      Ghaffari, Leila, DO     donepeziL (ARICEPT) 10 mg tablet  Take 1 Tablet by mouth nightly.  04/27/21      Ghaffari, Leila, DO     DULoxetine (CYMBALTA) 20 mg capsule  Take 1 Capsule by mouth two (2) times a day.  04/20/21      Ghaffari, Leila, DO     gabapentin (NEURONTIN) 300 mg capsule  Take 1 Capsule by mouth three (3) times daily. Max Daily Amount: 900 mg.  02/28/21      Joseph Art, MD     melatonin 3 mg cap capsule  Take 1 Capsule by mouth nightly.  02/28/21      Joseph Art, MD     buprenorphine  Haze Rushing) 20 mcg/hour  1 Patch by TransDERmal route every seven (7) days. Indications: severe chronic pain with opioid tolerance        Provider, Historical     polyethylene glycol (MIRALAX) 17 gram packet  Take 17 g by mouth daily. Indications: constipation        Provider, Historical     docusate sodium (COLACE) 100 mg capsule  Take 100 mg by mouth two (2) times a day. Indications: constipation        Provider, Historical     multivitamin (ONE A DAY) tablet  Take 1 Tablet by mouth daily.        Provider, Historical     acetaminophen (TYLENOL) 500 mg tablet  Take 1,000 mg by mouth every six (6) hours as  needed for Pain.        Provider, Historical            ibuprofen (MOTRIN) 200 mg tablet  Take 600 mg by mouth every six (6) hours as needed for Pain.        Provider, Historical           REVIEW OF SYSTEMS:      I am not able to complete the review of systems because:        The patient is intubated and sedated       The patient has altered mental status due to his acute medical problems       The patient has baseline aphasia from prior stroke(s)       The patient has baseline dementia and is not reliable historian       The patient is in acute medical distress and unable to provide information                     Total of 12 systems reviewed as follows:        POSITIVE= underlined text  Negative = text not underlined   General:  fever, chills, sweats, generalized weakness, weight loss/gain,       loss of appetite    Eyes:    blurred vision, eye pain, loss of vision, double vision   ENT:    rhinorrhea, pharyngitis    Respiratory:   cough, sputum production, SOB, DOE, wheezing, pleuritic pain    Cardiology:   chest pain, palpitations, orthopnea, PND, edema, syncope    Gastrointestinal:  abdominal pain , N/V, diarrhea, dysphagia, constipation, bleeding    Genitourinary:  frequency, urgency, dysuria, hematuria, incontinence    Muskuloskeletal :  arthralgia, myalgia, back pain   Hematology:  easy bruising, nose or gum  bleeding, lymphadenopathy    Dermatological: rash, ulceration, pruritis, color change / jaundice   Endocrine:   hot flashes or polydipsia    Neurological:  headache, dizziness, confusion, focal weakness, paresthesia,      Speech difficulties, memory loss, gait difficulty   Psychological: Feelings of anxiety, depression, agitation        Objective:     VITALS:     Visit Vitals      BP  (!) 116/46     Pulse  (!) 59     Temp  98.3 F (36.8 C)     Resp  18     Ht  5' 4"  (1.626 m)     Wt  53.8 kg (118 lb 9.7 oz)     SpO2  95%        BMI  20.36 kg/m           PHYSICAL EXAM:      General:    Alert, cooperative, no distress, appears stated age.      HEENT: Atraumatic, anicteric sclerae, pink conjunctivae      No oral ulcers, mucosa moist, throat clear, dentition fair   Neck:  Supple, symmetrical,  thyroid: non tender   Lungs:   Clear to auscultation bilaterally.  No Wheezing or Rhonchi. No rales.   Chest wall:  No tenderness  No Accessory muscle use.   Heart:   Regular  rhythm,  No  murmur   No edema   Abdomen:   Soft, non-tender. Not distended.  Bowel sounds normal   Extremities: No cyanosis.  No clubbing,       Skin turgor normal, Capillary refill  normal, Radial dial pulse 2+   Skin:     Not pale.  Not Jaundiced  No rashes    Psych:  Good insight.  Not depressed.  Not anxious or agitated.   Neurologic: EOMs intact. No facial asymmetry. No aphasia or slurred speech. Symmetrical strength, Sensation grossly intact. Alert and oriented X 4.       _______________________________________________________________________   Care Plan discussed with:           Comments         Patient             Family              RN         Care Manager                            Consultant:          _______________________________________________________________________   Expected  Disposition:       Home with Family          HH/PT/OT/RN       SNF/LTC          [facility]        ________________________________________________________________________   TOTAL TIME:  76  Minutes      Critical Care Provided     Minutes non procedure based              Comments             Reviewed previous records         >50% of visit spent in counseling and coordination of care    Discussion with patient and/or family and questions answered           ________________________________________________________________________   Signed: Madeline Bebout Lisabeth Pick, MD      Procedures: see electronic medical records for all procedures/Xrays and details which were not copied into this note but were reviewed prior to creation of Plan.      LAB DATA REVIEWED:       Recent Results (from the past 24 hour(s))     CBC WITH AUTOMATED DIFF          Collection Time: 05/24/21 10:57 PM         Result  Value  Ref Range            WBC  7.8  3.6 - 11.0 K/uL       RBC  4.07  3.80 - 5.20 M/uL       HGB  11.8  11.5 - 16.0 g/dL       HCT  38.1  35.0 - 47.0 %       MCV  93.6  80.0 - 99.0 FL       MCH  29.0  26.0 - 34.0 PG       MCHC  31.0  30.0 - 36.5 g/dL       RDW  12.6  11.5 - 14.5 %       PLATELET  217  150 - 400 K/uL       MPV  11.2  8.9 - 12.9 FL       NRBC  0.0  0 PER 100 WBC       ABSOLUTE NRBC  0.00  0.00 - 0.01 K/uL       NEUTROPHILS  59  32 - 75 %       LYMPHOCYTES  26  12 - 49 %       MONOCYTES  9  5 - 13 %       EOSINOPHILS  5  0 - 7 %       BASOPHILS  1  0 - 1 %       IMMATURE GRANULOCYTES  0  0.0 - 0.5 %       ABS. NEUTROPHILS  4.6  1.8 - 8.0 K/UL       ABS. LYMPHOCYTES  2.0  0.8 - 3.5 K/UL       ABS. MONOCYTES  0.7  0.0 - 1.0 K/UL       ABS. EOSINOPHILS  0.4  0.0 - 0.4 K/UL       ABS. BASOPHILS  0.1  0.0 - 0.1 K/UL       ABS. IMM. GRANS.  0.0  0.00 - 0.04 K/UL       DF  AUTOMATED          METABOLIC PANEL, COMPREHENSIVE          Collection Time: 05/24/21 10:57 PM         Result  Value  Ref Range            Sodium  139  136 - 145 mmol/L       Potassium  3.4 (L)  3.5 - 5.1 mmol/L       Chloride  109 (H)  97 - 108 mmol/L       CO2  23   21 - 32 mmol/L       Anion gap  7  5 - 15 mmol/L       Glucose  164 (H)  65 - 100 mg/dL       BUN  29 (H)  6 - 20 MG/DL       Creatinine  0.96  0.55 - 1.02 MG/DL       BUN/Creatinine ratio  30 (H)  12 - 20         eGFR  59 (L)  >60 ml/min/1.66m       Calcium  9.5  8.5 - 10.1 MG/DL       Bilirubin, total  0.1 (L)  0.2 - 1.0 MG/DL       ALT (SGPT)  28  12 - 78 U/L       AST (SGOT)  19  15 - 37 U/L       Alk. phosphatase  112  45 - 117 U/L       Protein, total  6.6  6.4 - 8.2 g/dL       Albumin  3.5  3.5 - 5.0 g/dL       Globulin  3.1  2.0 - 4.0 g/dL       A-G Ratio  1.1  1.1 - 2.2         TROPONIN-HIGH SENSITIVITY          Collection Time: 05/24/21 10:57 PM         Result  Value  Ref Range            Troponin-High Sensitivity  8  0 - 51 ng/L       EKG, 12 LEAD, INITIAL          Collection Time: 05/24/21 11:03 PM         Result  Value  Ref Range            Ventricular Rate  57  BPM  Atrial Rate  57  BPM       P-R Interval  146  ms       QRS Duration  84  ms       Q-T Interval  466  ms       QTC Calculation (Bezet)  453  ms       Calculated P Axis  71  degrees       Calculated R Axis  70  degrees       Calculated T Axis  83  degrees       Diagnosis                 ** Poor data quality, interpretation may be adversely affected   Sinus bradycardia   No previous ECGs available          POC CHEM8          Collection Time: 05/24/21 11:43 PM         Result  Value  Ref Range            Calcium, ionized (POC)  1.21  1.12 - 1.32 mmol/L       Sodium (POC)  145  136 - 145 mmol/L       Potassium (POC)  2.9 (L)  3.5 - 5.1 mmol/L       Chloride (POC)  113 (H)  98 - 107 mmol/L       Glucose (POC)  100  65 - 100 mg/dL       Creatinine (POC)  0.81  0.6 - 1.3 mg/dL       eGFR (POC)  >60  >60 ml/min/1.48m            Comment  Comment Not Indicated.

## 2021-05-25 NOTE — Progress Notes (Signed)
1940: Bedside shift change report given to Herbert Seta, RN (oncoming nurse) by Isaias Cowman, RN (offgoing nurse). Report included the following information SBAR, Kardex, Intake/Output, MAR, Recent Results, and Cardiac Rhythm SB .     End of Shift Note    Bedside shift change report given to Rhona, Charity fundraiser (oncoming nurse) by Arminda Resides, RN (offgoing nurse).  Report included the following information SBAR, Kardex, Intake/Output, MAR, Recent Results, and Cardiac Rhythm SB    Shift worked:  1900-0700     Shift summary and any significant changes:     Patient still sinus brady overnight.      Concerns for physician to address:       Zone phone for oncoming shift:          Activity:  Activity Level: Up with Assistance  Number times ambulated in hallways past shift: 0  Number of times OOB to chair past shift: 0    Cardiac:   Cardiac Monitoring: Yes      Cardiac Rhythm: Sinus Rhythm    Access:  Current line(s): PIV     Genitourinary:   Urinary status: voiding    Respiratory:   O2 Device: None (Room air)  Chronic home O2 use?: NO  Incentive spirometer at bedside: NO       GI:  Last Bowel Movement Date:  (PTA)  Current diet:  ADULT DIET Easy to Chew  DIET ONE TIME MESSAGE  Passing flatus: YES  Tolerating current diet: YES       Pain Management:   Patient states pain is manageable on current regimen: N/A    Skin:  Braden Score: 20  Interventions: float heels, increase time out of bed, foam dressing, PT/OT consult, limit briefs, internal/external urinary devices, and nutritional support     Patient Safety:  Fall Score:    Interventions: bed/chair alarm, assistive device (walker, cane, etc), gripper socks, pt to call before getting OOB, and stay with me (per policy)       Length of Stay:  Expected LOS: - - -  Actual LOS: 1      Arminda Resides, RN

## 2021-05-25 NOTE — Telephone Encounter (Signed)
Telephone Encounter by Maxine Glenn at 05/25/21 1126                Author: Maxine Glenn  Service: --  Author Type: --       Filed: 05/25/21 1128  Encounter Date: 05/24/2021  Status: Signed          Editor: Edgardo Roys states that the pharmacy needs to get the script sent to the pharmacy as soon as possible for gabapentin.  Mail order is sending 10 days to carry her over until we send  another script.      Please call Dorothyann Gibbs to advise when this has been sent to pharm       [facility] 90 day supply

## 2021-05-26 LAB — CBC WITH AUTO DIFFERENTIAL
Basophils %: 1 % (ref 0–1)
Basophils Absolute: 0 10*3/uL (ref 0.0–0.1)
Eosinophils %: 6 % (ref 0–7)
Eosinophils Absolute: 0.4 10*3/uL (ref 0.0–0.4)
Granulocyte Absolute Count: 0 10*3/uL (ref 0.00–0.04)
Hematocrit: 38.1 % (ref 35.0–47.0)
Hemoglobin: 11.5 g/dL (ref 11.5–16.0)
Immature Granulocytes: 0 % (ref 0.0–0.5)
Lymphocytes %: 34 % (ref 12–49)
Lymphocytes Absolute: 2.2 10*3/uL (ref 0.8–3.5)
MCH: 28.9 PG (ref 26.0–34.0)
MCHC: 30.2 g/dL (ref 30.0–36.5)
MCV: 95.7 FL (ref 80.0–99.0)
MPV: 10.7 FL (ref 8.9–12.9)
Monocytes %: 10 % (ref 5–13)
Monocytes Absolute: 0.6 10*3/uL (ref 0.0–1.0)
NRBC Absolute: 0 10*3/uL (ref 0.00–0.01)
Neutrophils %: 49 % (ref 32–75)
Neutrophils Absolute: 3.2 10*3/uL (ref 1.8–8.0)
Nucleated RBCs: 0 PER 100 WBC
Platelets: 198 10*3/uL (ref 150–400)
RBC: 3.98 M/uL (ref 3.80–5.20)
RDW: 13.1 % (ref 11.5–14.5)
WBC: 6.4 10*3/uL (ref 3.6–11.0)

## 2021-05-26 LAB — BASIC METABOLIC PANEL
Anion Gap: 3 mmol/L — ABNORMAL LOW (ref 5–15)
BUN: 12 MG/DL (ref 6–20)
Bun/Cre Ratio: 20 (ref 12–20)
CO2: 23 mmol/L (ref 21–32)
Calcium: 8.9 MG/DL (ref 8.5–10.1)
Chloride: 114 mmol/L — ABNORMAL HIGH (ref 97–108)
Creatinine: 0.59 MG/DL (ref 0.55–1.02)
ESTIMATED GLOMERULAR FILTRATION RATE: >60 mL/min/{1.73_m2} (ref >60)
Glucose: 94 mg/dL (ref 65–100)
Potassium: 4 mmol/L (ref 3.5–5.1)
Sodium: 140 mmol/L (ref 136–145)

## 2021-05-26 LAB — EKG 12-LEAD
Atrial Rate: 57 {beats}/min
P Axis: 71 degrees
P-R Interval: 146 ms
Q-T Interval: 466 ms
QRS Duration: 84 ms
QTc Calculation (Bazett): 453 ms
R Axis: 70 degrees
T Axis: 83 degrees
Ventricular Rate: 57 {beats}/min

## 2021-05-26 LAB — CBC WITH AUTOMATED DIFF
ABS. BASOPHILS: 0 10*3/uL (ref 0.0–0.1)
ABS. EOSINOPHILS: 0.4 10*3/uL (ref 0.0–0.4)
ABS. IMM. GRANS.: 0 10*3/uL (ref 0.00–0.04)
ABS. LYMPHOCYTES: 2.2 10*3/uL (ref 0.8–3.5)
ABS. MONOCYTES: 0.6 10*3/uL (ref 0.0–1.0)
ABS. NEUTROPHILS: 3.2 10*3/uL (ref 1.8–8.0)
ABSOLUTE NRBC: 0 10*3/uL (ref 0.00–0.01)
BASOPHILS: 1 % (ref 0–1)
EOSINOPHILS: 6 % (ref 0–7)
HCT: 38.1 % (ref 35.0–47.0)
HGB: 11.5 g/dL (ref 11.5–16.0)
IMMATURE GRANULOCYTES: 0 % (ref 0.0–0.5)
LYMPHOCYTES: 34 % (ref 12–49)
MCH: 28.9 PG (ref 26.0–34.0)
MCHC: 30.2 g/dL (ref 30.0–36.5)
MCV: 95.7 FL (ref 80.0–99.0)
MONOCYTES: 10 % (ref 5–13)
MPV: 10.7 FL (ref 8.9–12.9)
NEUTROPHILS: 49 % (ref 32–75)
NRBC: 0 PER 100 WBC
PLATELET: 198 10*3/uL (ref 150–400)
RBC: 3.98 M/uL (ref 3.80–5.20)
RDW: 13.1 % (ref 11.5–14.5)
WBC: 6.4 10*3/uL (ref 3.6–11.0)

## 2021-05-26 LAB — METABOLIC PANEL, BASIC
Anion gap: 3 mmol/L — ABNORMAL LOW (ref 5–15)
BUN/Creatinine ratio: 20 (ref 12–20)
BUN: 12 MG/DL (ref 6–20)
CO2: 23 mmol/L (ref 21–32)
Calcium: 8.9 MG/DL (ref 8.5–10.1)
Chloride: 114 mmol/L — ABNORMAL HIGH (ref 97–108)
Creatinine: 0.59 MG/DL (ref 0.55–1.02)
Glucose: 94 mg/dL (ref 65–100)
Potassium: 4 mmol/L (ref 3.5–5.1)
Sodium: 140 mmol/L (ref 136–145)
eGFR: >60 mL/min/{1.73_m2} (ref >60)

## 2021-05-26 LAB — EKG, 12 LEAD, INITIAL
Atrial Rate: 57 {beats}/min
Calculated R Axis: 70 degrees
Calculated T Axis: 83 degrees
P-R Interval: 146 ms
Q-T Interval: 466 ms
QRS Duration: 84 ms

## 2021-05-26 MED FILL — MAPAP (ACETAMINOPHEN) 325 MG TABLET: 325 mg | ORAL | Qty: 2

## 2021-05-26 MED FILL — ENOXAPARIN 40 MG/0.4 ML SUB-Q SYRINGE: 40 mg/0.4 mL | SUBCUTANEOUS | Qty: 0.4

## 2021-05-26 MED FILL — FAMOTIDINE 20 MG TAB: 20 mg | ORAL | Qty: 1

## 2021-05-26 MED FILL — DOCUSATE SODIUM 100 MG CAP: 100 mg | ORAL | Qty: 1

## 2021-05-26 NOTE — Progress Notes (Signed)
PCP hospital follow-up transitional care appointment has been scheduled with Dr. Alinda Money on 05/31/21 at 1030. Pending patient discharge. Zoe Martinez, Care Management Assistant

## 2021-05-26 NOTE — Telephone Encounter (Signed)
[facility] is requesting a call pertaining to Gabapentin    Ref [id number]

## 2021-05-26 NOTE — Discharge Summary (Signed)
Discharge Summary by Starr Sinclair, MD at 05/26/21 1145                Author: Starr Sinclair, MD  Service: Internal Medicine  Author Type: Physician       Filed: 05/26/21 1149  Date of Service: 05/26/21 1145  Status: Signed          Editor: Starr Sinclair, MD (Physician)                                       Hospitalist Discharge Summary        Patient ID:   Zoe Martinez   [id number]   81 y.o.   May 11, 1940   05/24/2021      PCP on record: Gillian Scarce, MD      Admit date: 05/24/2021   Discharge date and time: 05/26/2021      DISCHARGE DIAGNOSIS:      Unintentional Prescription Drug overdose    Multiple syncopal episode possibly 2/2 hypotension 2/2 Anti Hypertensive consumption   Accidental drug overdose   -Patient doing well and stable for discharge       Dementia   Continue Aricept and namenda        GERD   Continue Pepcid           Hypokalemia   resolved      CONSULTATIONS:   None      Excerpted HPI from H&P of [name], DO:   81 year old female currently on treatment for arthritis, dementia, GERD, hypertension presented to the hospital for evaluation of mental syncopal episode, patient was found by her husband sitting next to empty pillbox, patient accidentally take extra  lisinopril and metoprolol, patient poor history   Unable to provide history, most of the history was obtained from the documentation, patient was noted to be hypotensive, started with IV fluid, blood work was done show no significant acute abnormality.      DISCHARGE SUMMARY/HOSPITAL COURSE:   for full details see H&P, daily progress notes, labs, consult notes.       Patient accidentally husband prescription medication lisinopril and metoprolol which resulted in episodes of hypotension and near syncope/syncope. Patient monitored overnight and being discharged in  stable condition.         _______________________________________________________________________   Patient seen and examined by me on discharge day.    Pertinent Findings:   Gen:    Not in distress   Chest: Clear lungs   CVS:   Regular rhythm.  No edema   Abd:  Soft, not distended, not tender   Neuro:  Alert,    _______________________________________________________________________   DISCHARGE MEDICATIONS:      Current Discharge Medication List                 CONTINUE these medications which have NOT CHANGED          Details        gabapentin (NEURONTIN) 300 mg capsule  Take 1 Capsule by mouth three (3) times daily. Max Daily Amount: 900 mg.   Qty: 90 Capsule, Refills: 2   Start date: 05/25/2021          Comments: Not to exceed 3 additional fills before 05/09/2021   Associated Diagnoses: Chronic pain syndrome               famotidine (PEPCID) 20 mg tablet  TAKE 1  TABLET BY MOUTH TWICE  DAILY   Qty: 180 Tablet, Refills: 3          Comments: Requesting 1 year supply   Associated Diagnoses: Epigastric pain               memantine (NAMENDA) 10 mg tablet  TAKE 1 TABLET BY MOUTH TWO TIMES A DAY.   Qty: 60 Tablet, Refills: 3               donepeziL (ARICEPT) 10 mg tablet  Take 1 Tablet by mouth nightly.   Qty: 30 Tablet, Refills: 3               DULoxetine (CYMBALTA) 20 mg capsule  Take 1 Capsule by mouth two (2) times a day.   Qty: 180 Capsule, Refills: 1               melatonin 3 mg cap capsule  Take 1 Capsule by mouth nightly.   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 Indications: severe chronic pain with opioid tolerance               polyethylene glycol (MIRALAX) 17 gram packet  Take 17 g by mouth daily. Indications: constipation               docusate sodium (COLACE) 100 mg capsule  Take 100 mg by mouth two (2) times a day. Indications: constipation               multivitamin (ONE A DAY)  tablet  Take 1 Tablet by mouth daily.               acetaminophen (TYLENOL) 500 mg tablet  Take 1,000 mg by mouth every six (6) hours as needed for Pain.               ibuprofen (MOTRIN) 200 mg tablet  Take 600 mg by mouth every six (6) hours as needed for Pain.                            Patient Follow Up Instructions:    Activity: Activity as tolerated   Diet: Regular Diet   Wound Care: None needed      Follow-up with PCP in 2 weeks.   Follow-up tests/labs none        Follow-up Information                  Follow up With  Specialties  Details  Why  Contact Info              Gillian Scarce, MD  Internal Medicine Physician      [address]   [address]   [place] [address]   [phone]                     ________________________________________________________________      Risk of deterioration: Low      Condition at Discharge:  Stable   __________________________________________________________________      Disposition   Home with family, no needs      ____________________________________________________________________  Code Status: Full Code   ___________________________________________________________________         Total time in minutes spent coordinating this discharge (includes going over instructions, follow-up, prescriptions, and preparing report for sign off to her PCP) :  35 minutes      Signed:   Azzie Almas, MD

## 2021-05-26 NOTE — Progress Notes (Signed)
Patient discharged. Escorted to car on wheelchair. Condition stable. All discharge instructions given

## 2021-05-26 NOTE — Progress Notes (Signed)
Progress Notes by Hamilton Capri, PT at 05/26/21 [phone]                Author: Hamilton Capri, PT  Service: Physical Therapy  Author Type: Physical Therapist       Filed: 05/26/21 0902  Date of Service: 05/26/21 0858  Status: Signed          Editor: Hamilton Capri, PT (Physical Therapist)               PHYSICAL THERAPY EVALUATION/DISCHARGE   Patient: Zoe Martinez (81 y.o. female)   Date: 05/26/2021   Primary Diagnosis: Overdose [T50.901A]         Precautions:   Fall           ASSESSMENT   Based on the objective data described below, the patient presents on 5/2 with AMS and syncope after mistakenly took spouse's medication. Pt with baseline dementia and oriented to self only and relied  on spouse for orientation and baseline questions. Pt tolerated evaluation well. Pt's BP stable during positional changes. Pt completed bed mobility and transfers independently. Pt tolerated gait training x 35 ft with SBA demonstrating decreased cadence,  narrow BOS. No LOB noted. Anticipate pt is mobilizing at baseline mobility status and does not require any further acute PT needs or follow up. Recommend home with increased family supervision and assistance      Recommend OOB to the chair for all meals, walk to and from the bathroom with RN assistance, keep blinds open and pt awake during the daytime to minimize hospital delirium.       Functional Outcome Measure:  The patient scored 23/28 on the tinetti outcome measure which is indicative of mod risk for falls.        Other factors to consider for discharge: none       Further skilled acute physical therapy is not indicated at this time.          PLAN :   Recommendation for discharge: (in order for the patient to meet his/her long term  goals)   No skilled physical therapy/ follow up rehabilitation needs identified at this time.      This discharge recommendation:   Has been made in collaboration with the attending provider and/or case management      IF patient discharges  home will need the following DME: none             SUBJECTIVE:     Patient stated I love being outside.        OBJECTIVE DATA SUMMARY:     HISTORY:       Past Medical History:        Diagnosis  Date         ?  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  Dementia (HCC)            signs own paperwork         ?  GERD (gastroesophageal reflux disease)       ?  SBO (small bowel obstruction) (HCC)           ?  Skin cancer            Past Surgical History:         Procedure  Laterality  Date          ?  HX AFIB ABLATION         ?  HX COLONOSCOPY              ?  HX SMALL BOWEL RESECTION               Prior level of function: independent, ambulatory without AD, lives with supportive spouse, son lives next door   Personal factors and/or comorbidities impacting plan of care:       Home Situation   Home Environment: Private residence   # Steps to Enter: 8   Rails to Enter: Yes   Hand Rails : Right   One/Two Story Residence: One story   Living Alone: No   Support Systems: Spouse/Significant Other, Child(ren), Other Family Member(s) (son lives next door)   Patient Expects to be Discharged to:: Home with family assistance   Current DME Used/Available at Home: None      EXAMINATION/PRESENTATION/DECISION MAKING:    Critical Behavior:   Neurologic State: Alert, Confused   Orientation Level: Oriented to person   Cognition: Poor safety awareness, Impaired decision making, Follows commands   Safety/Judgement: Decreased awareness of environment, Decreased awareness of need for assistance, Decreased awareness of need for safety, Decreased insight into deficits   Hearing:   Auditory   Auditory Impairment: None   Skin:     Edema:    Range Of Motion:   AROM: Within functional limits                               Strength:     Strength: Within functional limits                           Tone & Sensation:                        Sensation: Intact                    Coordination:       Vision:    Tracking: Able to track stimulus in all  quadrants w/o difficulty   Corrective Lenses: Glasses   Functional Mobility:   Bed Mobility:       Supine to Sit: Independent;Additional time       Scooting: Independent   Transfers:   Sit to Stand: Independent   Stand to Sit: Independent          Bed to Chair: Stand-by assistance                  Balance:    Sitting: Intact   Standing: Intact   Ambulation/Gait Training:   Distance (ft): 40 Feet (ft)   Assistive Device: Gait belt   Ambulation - Level of Assistance: Stand-by assistance           Gait Abnormalities: Trunk sway increased           Base of Support: Narrowed       Speed/Cadence: Pace decreased (<100 feet/min)   Step Length: Right shortened;Left shortened          Functional Measure:   Tinetti test:        Sitting Balance: 1   Arises: 1   Attempts to Rise: 1   Immediate Standing Balance: 1   Standing Balance: 1   Nudged: 2   Eyes Closed: 1   Turn 360 Degrees - Continuous/Discontinuous: 1   Turn 360 Degrees - Steady/Unsteady: 1   Sitting Down:  2   Balance Score: 12 Balance total score   Indication of Gait: 1   R Step Length/Height: 1   L Step Length/Height: 1   R Foot Clearance: 1   L Foot Clearance: 1   Step Symmetry: 1   Step Continuity: 1   Path: 2   Trunk: 1   Walking Time: 1   Gait Score: 11 Gait total score   Total Score: 23/28 Overall total score                Tinetti Tool Score Risk of Falls   <19 = High Fall Risk   19-24 = Moderate Fall Risk   25-28 = Low Fall Risk   Tinetti ME. Performance-Oriented Assessment of Mobility Problems in Elderly Patients. JAGS 1986; [id number]. (Scoring Description: PT Bulletin Feb. 10, 1993)      Older adults: Lonn Georgia et al, 2009; n  = 1000 [place] elderly evaluated with ABC, POMA, ADL, and IADL)    Mean POMA score for males aged 65-79 years = 26.21(3.40)    Mean POMA score for females age 76-79 years = 25.16(4.30)    Mean POMA score for males over 80 years = 23.29(6.02)    Mean POMA score for females over 80 years = 17.20(8.32)            Based on the above  components, the patient evaluation is determined to be of the following complexity level: LOW       Pain Rating:   Pt denied pain      Activity Tolerance:    Good         After treatment patient left in no apparent distress:    Sitting in chair, Call bell within reach, Bed / chair alarm activated, and Side rails x 3        COMMUNICATION/EDUCATION:     The patients plan of care was discussed with: Occupational therapist and Registered nurse.       Fall prevention education was provided and the patient/caregiver indicated understanding., Patient/family have participated as able in goal setting and plan of care., and Patient/family agree to work  toward stated goals and plan of care.      Thank you for this referral.   Hamilton Capri, PT, DPT    Time Calculation: 21 mins

## 2021-05-26 NOTE — Telephone Encounter (Signed)
Telephone Encounter by Maryellen Pile at 05/26/21 1533                Author: Maryellen Pile  Service: --  Author Type: Technician       Filed: 05/26/21 1534  Encounter Date: 05/26/2021  Status: Signed          Editor: Maryellen Pile (Technician)               Attempted to call optum, put on hold, phone disconnected.

## 2021-05-26 NOTE — Progress Notes (Signed)
OCCUPATIONAL THERAPY EVALUATION/DISCHARGE  Patient: Zoe Martinez (81 y.o. female)  Date: 05/26/2021  Primary Diagnosis: Overdose [T50.901A]       Precautions:   Fall    ASSESSMENT  Based on the objective data described below, the patient presents with baseline dementia with accompanying memory impairment and confusion though functionally patient appears to be at her baseline. Indep for bed mobility and transfers today. BP stable and increases with activity appropriately. Demo's good safety awareness with transfers and ability to complete ADLs at supervision level. Patient left sitting up in chair at end of session on chair alarm, supportive husband at bedside.  No further acute OT needs identified at this time; please re-consult if change in status or further needs arise. Recommending home with family assistance when medically ready.     Current Level of Function (ADLs/self-care): Supervision-Indep for mobility, ADLS, and transfers    Functional Outcome Measure: The patient scored 20/24 on the AMPAC outcome measure which is indicative of decreased likelihood for SNF/IPR at discharge .      Other factors to consider for discharge: increased family supervision at DC     PLAN :  Recommend with staff: OOB to recliner as tolerated. OOB to BSC/bathroom with assist x1 and gait belt for safety     Recommendation for discharge: (in order for the patient to meet his/her long term goals)  No skilled occupational therapy/ follow up rehabilitation needs identified at this time.    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    Dementia (HCC)     signs own paperwork    GERD (gastroesophageal reflux disease)     SBO (small bowel  obstruction) (HCC)     Skin cancer      Past Surgical History:   Procedure Laterality Date    HX AFIB ABLATION      HX COLONOSCOPY      HX SMALL BOWEL RESECTION         Prior Level of Function/Environment/Context:  Expanded or extensive additional review of patient history:   Home Situation  Home Environment: Private residence  # Steps to Enter: 8  Rails to Enter: Yes  Hand Rails : Right  One/Two Story Residence: One story  Living Alone: No  Support Systems: Spouse/Significant Other, Child(ren), Other Family Member(s) (son lives next door)  Patient Expects to be Discharged to:: Home with family assistance  Current DME Used/Available at Home: None    Hand dominance: Right    EXAMINATION OF PERFORMANCE DEFICITS:  Cognitive/Behavioral Status:  Neurologic State: Alert;Confused  Orientation Level: Oriented to person  Cognition: Poor safety awareness;Impaired decision making;Follows commands  Perception: Appears intact  Perseveration: No perseveration noted  Safety/Judgement: Decreased awareness of environment;Decreased awareness of need for assistance;Decreased awareness of need for safety;Decreased insight into deficits    Skin: WNL    Edema: None    Hearing:  Auditory  Auditory Impairment: None    Vision/Perceptual:    Tracking: Able to track stimulus in all quadrants w/o difficulty  Corrective Lenses: Glasses    Range of Motion:  AROM: Within functional limits                         Strength:  Strength: Within functional limits                Coordination:     Fine Motor Skills-Upper: Left Intact;Right Intact    Gross Motor Skills-Upper: Left Intact;Right Intact    Tone & Sensation:     Sensation: Intact                      Balance:  Sitting: Intact  Standing: Intact    Functional Mobility and Transfers for ADLs:  Bed Mobility:  Supine to Sit: Independent;Additional time  Scooting: Independent    Transfers:  Sit to Stand: Independent  Stand to Sit: Independent  Bed to Chair: Stand-by  assistance  Bathroom Mobility: Stand-by assistance  Toilet Transfer : Stand-by assistance    ADL Assessment:  Feeding: Setup    Oral Facial Hygiene/Grooming: Setup    Bathing: Supervision         Upper Body Dressing: Supervision    Lower Body Dressing: Supervision    Toileting: Supervision                ADL Intervention and task modifications:       Grooming  Grooming Assistance: Stand-by assistance  Washing Hands: Stand-by assistance                             Toileting  Toileting Assistance: Supervision  Bladder Hygiene: Supervision  Bowel Hygiene: Supervision  Clothing Management: Supervision  Adaptive Equipment: Grab bars    Cognitive Retraining  Orientation Retraining: Place;Reorienting;Situation;Time  Safety/Judgement: Decreased awareness of environment;Decreased awareness of need for assistance;Decreased awareness of need for safety;Decreased insight into deficits    Functional Measure:  [facility]      Daily Activity Inpatient Short Form (6-Clicks) Version 2  How much HELP from another person do you currently need... (If the patient hasn't done an activity recently, how much help from another person do you think they would need if they tried?) Total A Lot A Little None   1.  Putting on and taking off regular lower body clothing? []    1 []    2 [x]    3 []    4   2.  Bathing (including washing, rinsing, drying)? []    1 []    2 [x]    3 []    4   3.  Toileting, which includes using toilet, bedpan, or urinal? []    1 []    2 [x]    3 []    4   4.  Putting on and taking off regular upper body clothing? []    1 []    2 [x]    3 []    4   5.  Taking care of personal grooming such as brushing teeth? []    1 []    2 []    3 [x]    4   6.  Eating meals? []    1 []    2 []    3 [x]    4     Raw Score: 20/24                            Cutoff score ?191,2,3 had higher odds of discharging home with home  health or need of SNF/IPR    1. Emelia Loroniane U. Jette, Janeece RiggersMary Stilphen, Vinoth Fransico MeadowK. Ranganathan, Lupe CarneySandra D. Passek, Thornton DalesFrederick S. Cassandria AngerFrost,  Alan M. Jette.  Validity of the AM-PAC "6-Clicks" Inpatient Daily Activity and Basic Mobility Short Forms. Physical Therapy Mar 2014, [phone]) [phone]; DOI: 10.2522/ptj.20130199  2. Venetia NightWarren M, Knecht J, Verheijde J, Tompkins J. Association of AM-PAC "6-Clicks" Basic Mobility and Daily Activity Scores With Discharge Destination. Phys Ther. 2021 Apr 4;101(4):pzab043. doi: 10.1093/ptj/pzab043. PMID: 1610960433517463.  3. Herbold J, Rajaraman D, Lubertha Basqueaylor S, Agayby K, Mattapois[place] CenterBabyar S. Activity Measure for Post-Acute Care "6-Clicks" Basic Mobility Scores Predict Discharge Destination After Acute Care Hospitalization in Select Patient Groups: A Retrospective, Observational Study. Arch Rehabil Res Clin Transl. 2022 Jul 16;4(3):100204. doi: 10.1016/j.[id number]. PMID: 7829562136123982; PMC: [id number]: [id number].  4. Josefina DoJette A, Haley S, Coster W, Ni P. AM-PAC Short Forms Manual 4.0. Revised 02/2018.     Occupational Therapy Evaluation Charge Determination   History Examination Decision-Making   LOW Complexity : Brief history review  LOW Complexity : 1-3 performance deficits relating to physical, cognitive , or psychosocial skils that result in activity limitations and / or participation restrictions  LOW Complexity : No comorbidities that affect functional and no verbal or physical assistance needed to complete eval tasks       Based on the above components, the patient evaluation is determined to be of the following complexity level: LOW   Pain Rating:  None    Activity Tolerance:   WNL    After treatment patient left in no apparent distress:    Sitting in chair, Call bell within reach, Bed / chair alarm activated, and Caregiver / family present    COMMUNICATION/EDUCATION:   The patient's plan of care was discussed with: Physical therapist and Registered nurse.     Thank you for this referral.  Allayne StackIsabelle Babson, OT  Time Calculation: 22 mins

## 2021-05-31 ENCOUNTER — Ambulatory Visit
Admit: 2021-05-31 | Payer: MEDICARE | Attending: Student in an Organized Health Care Education/Training Program | Primary: Student in an Organized Health Care Education/Training Program

## 2021-05-31 DIAGNOSIS — Z09 Encounter for follow-up examination after completed treatment for conditions other than malignant neoplasm: Secondary | ICD-10-CM

## 2021-05-31 NOTE — Progress Notes (Signed)
Zoe Martinez is a 81 y.o. female    Chief Complaint   Patient presents with    Follow-Up from [facility]     May 2nd 2023 ER visit sycope and collapse from taking wrong medicaiton       BP (!) 150/67 (Site: Left Upper Arm, Position: Sitting)   Pulse 65   Temp 98.1 F (36.7 C) (Temporal)   Resp 16   Ht 5\' 4"  (1.626 m)   Wt 114 lb 12.8 oz (52.1 kg)   SpO2 95%   BMI 19.71 kg/m         1. Have you been to the ER, urgent care clinic since your last visit?  Hospitalized since your last visit? YES    2. Have you seen or consulted any other health care providers outside of the [facility] since your last visit?  Include any pap smears or colon screening. NO

## 2021-05-31 NOTE — Progress Notes (Addendum)
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 Ander Purpura, on memantine and donepezil.    - Sundowning improved with melatonin and adjustment of husbands sleep schedule to be in line with hers.      Chronic pain: neck and back pain due to herniated disc   R CTS: recently underwent repair   - working with Dr. Sallyanne Kuster, currently on gabapentin 300 mg tid, buprenorphine patch, duloxetine 20 mg daily, ibuprofen 200 mg q6h prn      Polymyalgia rheumatica: - s/p prednisone     Hyperlipidemia- will plan to obtain lipid panel at next visit when fasting       Hypertension -  will hold off on treatment for now given age, dementia, recently hypotension and syncope      GERD:   - improved with famotidine but recently sx have worsened off the medication, will refill it for now though sx may improve off of prednisone.      Constipation: miralax and colase      Pelvic organ prolapse/Cystocele: pessary placed      Hx of basal cell carcinoma/actinic keratosis: previously on tretinoin 0.025% cream.      Atrial fibrillation: s/p ablation at [facility] about 10 years ago.     Follow up:  Return in about 6 months (around 12/01/2021).    - Medicare yearly exam and discussion of GOC      Follow up:  No follow-up provider specified.    Zoe Scarce, MD    CC:     Chief Complaint   Patient presents with    Follow-Up from [facility]     May 2nd 2023 ER visit sycope and collapse from taking wrong medicaiton       HPI:     Zoe Martinez is a 81 y.o. female who presents for:    Hospital follow up.    Pt took her husbands blood pressure medication and had 2 syncopal episodes after that and then admitted to the hospital.     Pt is feeling welll, no lightheadedness, dizziness, or further episodes of  passing out since being discharged from the hospital.  She has not been checking her blood pressure at home.    Dementia: She has been getting to bed better.  Her husband has been adjusting his sleep schedule to be more in line with hers so that she is not awake in the home without anyone else around to help her. Son has been working on Scientist, water quality with attorney but they need a mental status exam. MOCA today was 16.         ROS:   All 10 point review of systems negative except those mentioned in the HPI.         Past Medical History:     Active Ambulatory Problems     Diagnosis Date Noted    Hyperlipidemia, unspecified hyperlipidemia type 11/03/2020    Constipation, unspecified constipation type 11/03/2020    Chronic pain syndrome 11/03/2020    Dementia, unspecified dementia severity, unspecified dementia type, unspecified whether behavioral, psychotic, or mood disturbance or anxiety (HCC) 11/03/2020    Polymyalgia rheumatica (HCC) 11/03/2020    Major depressive disorder,  recurrent, mild (HCC) 12/09/2020    Major depressive disorder, recurrent, moderate (HCC) 12/09/2020    Major depressive disorder, recurrent, unspecified (HCC) 12/09/2020    Other long term (current) drug therapy 02/10/2021    Overdose 05/25/2021     Resolved Ambulatory Problems     Diagnosis Date Noted    Routine physical examination 11/03/2020     Past Medical History:   Diagnosis Date    Arthritis     Dementia (HCC)     GERD (gastroesophageal reflux disease)     SBO (small bowel obstruction) (HCC)     Skin cancer           Current Medications:     Current Outpatient Medications:     predniSONE (DELTASONE) 5 MG tablet, Take by mouth, Disp: , Rfl:     loratadine (CLARITIN) 10 MG capsule,  daily, 0 Refill(s), Disp: , Rfl:     Multiple Vitamin (MULTIVITAMIN PO), Take 1 tablet by mouth daily, Disp: , Rfl:     Polyethylene Glycol 3350 (MIRALAX PO), Take by mouth, Disp: , Rfl:     acetaminophen (TYLENOL) 500 MG tablet, Take 2 tablets by mouth every 6  hours as needed, Disp: , Rfl:     buprenorphine 20 MCG/HR PTWK, Place 1 patch onto the skin every 7 days. Max Daily Amount: 1 patch, Disp: , Rfl:     docusate (COLACE, DULCOLAX) 100 MG CAPS, Take 100 mg by mouth 2 times daily, Disp: , Rfl:     donepezil (ARICEPT) 10 MG tablet, Take 1 tablet by mouth nightly, Disp: , Rfl:     DULoxetine (CYMBALTA) 20 MG extended release capsule, Take 1 capsule by mouth 2 times daily, Disp: , Rfl:     gabapentin (NEURONTIN) 300 MG capsule, Take 1 capsule by mouth 3 times daily., Disp: , Rfl:     ibuprofen (ADVIL;MOTRIN) 200 MG tablet, Take 3 tablets by mouth every 6 hours as needed, Disp: , Rfl:     memantine (NAMENDA) 10 MG tablet, Take 1 tablet by mouth 2 times daily, Disp: , Rfl:     polyethylene glycol-electrolytes (NULYTELY) 420 g solution, Take 4,000 mLs by mouth (Patient not taking: Reported on 05/31/2021), Disp: , Rfl:     famotidine (PEPCID) 20 MG tablet, Take 1 tablet by mouth 2 times daily (Patient not taking: Reported on 05/31/2021), Disp: , Rfl:     Melatonin 3 MG CAPS, Take 1 capsule by mouth nightly (Patient not taking: Reported on 05/31/2021), Disp: , Rfl:     polyethylene glycol (GLYCOLAX) 17 GM/SCOOP powder, Take 17 g by mouth daily (Patient not taking: Reported on 05/31/2021), Disp: , Rfl:       Past Surgical History:     Past Surgical History:   Procedure Laterality Date    ABLATION OF DYSRHYTHMIC FOCUS      COLONOSCOPY      SMALL INTESTINE SURGERY           Family History:   No family history on file.      Social History:     Social History     Socioeconomic History    Marital status: Married     Spouse name: Not on file    Number of children: Not on file    Years of education: Not on file    Highest education level: Not on file   Occupational History    Not on file   Tobacco Use    Smoking status: Never  Smokeless tobacco: Never   Substance and Sexual Activity    Alcohol use: Not Currently    Drug use: Not Currently    Sexual activity: Not on file   Other Topics  Concern    Not on file   Social History Narrative    Not on file     Social Determinants of Health     Financial Resource Strain: Low Risk     Difficulty of Paying Living Expenses: Not hard at all   Food Insecurity: No Food Insecurity    Worried About Running Out of Food in the Last Year: Never true    Ran Out of Food in the Last Year: Never true   Transportation Needs: Not on file   Physical Activity: Not on file   Stress: Not on file   Social Connections: Not on file   Intimate Partner Violence: Not on file   Housing Stability: Not on file            BP (!) 150/67 (Site: Left Upper Arm, Position: Sitting)   Pulse 65   Temp 98.1 F (36.7 C) (Temporal)   Resp 16   Ht 5\' 4"  (1.626 m)   Wt 114 lb 12.8 oz (52.1 kg)   SpO2 95%   BMI 19.71 kg/m     Physical Exam:   General - Well appearing female  HEENT - PERRL, TM no erythema/opacification, normal nasal turbinates, no oropharyngeal erythema or exudate, MMM  Neck - supple, no thyroidomegaly, no lymphadenopathy  Pulm - clear to auscultation bilaterally  Cardio - RRR, normal S1 S2, no murmur  Abd - soft, nontender, no masses, no HSM  Extrem - no edema, +2 distal pulses  Neuro-  Alert and oriented, No focal deficits           Labs/Imaging:     Labs and imaging reviewed by me and significant for:        Please note that this dictation was completed in part with Dragon, the computer voice recognition software.  Quite often unanticipated grammatical, syntax, homophones, and other interpretive errors are inadvertently transcribed by the computer software.  Please disregard these errors.  Please excuse any errors that have escaped final proofreading.

## 2021-06-16 NOTE — Telephone Encounter (Signed)
Telephone Encounter by Berneda Rose at 06/16/21 1053                Author: Berneda Rose  Service: --  Author Type: --       Filed: 06/16/21 1053  Encounter Date: 06/16/2021  Status: Signed          Editor: Berneda Rose               called the pt and spoke to her son who verbally  confirmed the cancellation of his mum's appt on 07/12/21. 06/16/21

## 2021-07-08 MED ORDER — MEMANTINE HCL 10 MG PO TABS
10 MG | ORAL_TABLET | Freq: Two times a day (BID) | ORAL | 1 refills | Status: DC
Start: 2021-07-08 — End: 2021-07-25

## 2021-07-08 NOTE — Telephone Encounter (Signed)
Received fax from [facility] requesting refill on Namenda.  Last OV was 12/09/20  Last filled on 04/29/21 # 60 with 3 refills

## 2021-07-10 ENCOUNTER — Encounter

## 2021-07-11 NOTE — Telephone Encounter (Signed)
PCP: Gillian Scarce, MD    Last appt: 05/31/2021  Future Appointments   Date Time Provider Department Center   08/16/2021  8:40 AM Virgel Manifold, DO [facility] AM[facility]   12/01/2021  8:30 AM Gillian Scarce, MD [facility] AM[facility]       Requested Prescriptions     Pending Prescriptions Disp Refills    gabapentin (NEURONTIN) 300 MG capsule [Pharmacy Med Name: Gabapentin 300 MG Oral Capsule] 90 capsule 11     Sig: TAKE 1 CAPSULE BY MOUTH 3 TIMES  DAILY . MAXIMUM DAILY DOSE:  900MG 

## 2021-07-12 ENCOUNTER — Encounter: Attending: Internal Medicine | Primary: Student in an Organized Health Care Education/Training Program

## 2021-07-12 MED ORDER — GABAPENTIN 300 MG PO CAPS
300 MG | ORAL_CAPSULE | ORAL | 5 refills | Status: AC
Start: 2021-07-12 — End: 2021-08-16

## 2021-07-25 MED ORDER — MEMANTINE HCL 10 MG PO TABS
10 MG | ORAL_TABLET | Freq: Two times a day (BID) | ORAL | 1 refills | Status: DC
Start: 2021-07-25 — End: 2021-08-16

## 2021-07-25 NOTE — Telephone Encounter (Signed)
Received fax from [facility] requesting refill on Namenda. Looks like you e scribed on 07/08/21 however I do not see a pharmacy that it went to

## 2021-07-29 NOTE — Telephone Encounter (Signed)
Received fax from [facility] requesting refill on aricept  Last OV was 12/09/20  Next is scheduled for 08/16/21. Last filled on

## 2021-07-31 MED ORDER — DONEPEZIL HCL 10 MG PO TABS
10 MG | ORAL_TABLET | Freq: Every evening | ORAL | 1 refills | Status: DC
Start: 2021-07-31 — End: 2021-08-16

## 2021-08-12 ENCOUNTER — Encounter
Attending: Student in an Organized Health Care Education/Training Program | Primary: Student in an Organized Health Care Education/Training Program

## 2021-08-12 ENCOUNTER — Ambulatory Visit
Attending: Student in an Organized Health Care Education/Training Program | Primary: Student in an Organized Health Care Education/Training Program

## 2021-08-16 ENCOUNTER — Ambulatory Visit
Admit: 2021-08-16 | Payer: MEDICARE | Attending: Internal Medicine | Primary: Student in an Organized Health Care Education/Training Program

## 2021-08-16 ENCOUNTER — Encounter: Attending: Internal Medicine | Primary: Student in an Organized Health Care Education/Training Program

## 2021-08-16 DIAGNOSIS — F03B0 Unspecified dementia, moderate, without behavioral disturbance, psychotic disturbance, mood disturbance, and anxiety: Secondary | ICD-10-CM

## 2021-08-16 MED ORDER — MEMANTINE HCL 10 MG PO TABS
10 MG | ORAL_TABLET | Freq: Two times a day (BID) | ORAL | 1 refills | Status: AC
Start: 2021-08-16 — End: 2022-02-12

## 2021-08-16 MED ORDER — DONEPEZIL HCL 10 MG PO TABS
10 MG | ORAL_TABLET | Freq: Every evening | ORAL | 1 refills | Status: DC
Start: 2021-08-16 — End: 2022-01-09

## 2021-08-16 NOTE — Progress Notes (Signed)
Chief Complaint   Patient presents with    Dementia     Follow up - memory has worsened since last visit      1. Have you been to the ER, urgent care clinic since your last visit?  Hospitalized since your last visit? No     2. Have you seen or consulted any other health care providers outside of the [facility] since your last visit?  Include any pap smears or colon screening.  Yes Dr Sallyanne Kuster

## 2021-08-16 NOTE — Progress Notes (Signed)
Neurology Note    Patient ID:  Zoe Martinez  [id number]  81 y.o.  1940-07-05      Date of Consultation:  August 16, 2021      Assessment and Plan:  81 year old female presenting with dementia on Aricept and Namenda.  Dementia most consistent with Alzheimer's disease.  Discussed with patient's family that we could send a referral to Dr. Corky Downs however at this time they would like to hold off.  We will continue Aricept 10 mg at bedtime, Namenda 10 mg twice daily.  Patient has had a brain MRI without contrast performed in the past, still do not have report from family.    Labs including B12, folate, MMA and TSH were unremarkable.    Recommendations:  - Continue Aricept and Namenda      Problem was discussed at length with the patient.  We reviewed the results of the study in detail.  We also discussed prognosis and treatment options.  The patient had opportunity today to ask all questions, expressed understanding of the instructions provided, and agreed with the plan of treatment.      Follow up in 6 months.      I spent 60 minutes providing care to this patient, reviewing the patient's chart, notes, labs, medications and preparing documentation along with >50% of the time spent counseling patient during the encounter.         History of Present Illness:   Zoe Martinez is a 81 y.o. female with history of arthritis and memory issues consistent with dementia, likely Alzheimer's disease, over the last few years.    She presents again for follow-up with her son in the room as well as husband.    Symptoms since last visit have been essentially stable without any significant changes.  She does not have any worsening in her memory, does not have any drastic changes from before.  No hallucinations or behavioral issues.  She does still continue to do perseverative tasks such as pulling grass in the yard.  She is able to recognize her husband and her son without any issues.    Patient's son is working on getting power of attorney  for his mother.    No side effects to the medications.    Scored a 16 on her MoCA test from her PCP.      Past Medical History:   Diagnosis Date    Arthritis     mostly in back    Dementia (HCC)     signs own paperwork    GERD (gastroesophageal reflux disease)     SBO (small bowel obstruction) (HCC)     Skin cancer         Past Surgical History:   Procedure Laterality Date    ABLATION OF DYSRHYTHMIC FOCUS      COLONOSCOPY      SMALL INTESTINE SURGERY          No family history on file.     Social History     Tobacco Use    Smoking status: Never    Smokeless tobacco: Never   Substance Use Topics    Alcohol use: Not Currently        Allergies   Allergen Reactions    Flecainide Anaphylaxis    Iodinated Contrast Media Shortness Of Breath     Uncoded Allergy. Allergen: ASPARATANE  Uncoded Allergy. Allergen: ASPARATANE  Uncoded Allergy. Allergen: ASPARATANE  Uncoded Allergy. Allergen: ASPARATANE      Penicillins Shortness Of Breath  Simvastatin Other (See Comments)     Other Reaction: muscle cramps  Other reaction(s): Other (See Comments)  Other Reaction: muscle cramps  Other Reaction: muscle cramps  Other Reaction: muscle cramps      Other Other (See Comments)     Flu vaccine        Prior to Admission medications    Medication Sig Start Date End Date Taking? Authorizing Provider   donepezil (ARICEPT) 10 MG tablet Take 1 tablet by mouth nightly 07/31/21  Yes Jguadalupe Opiela, DO   memantine (NAMENDA) 10 MG tablet Take 1 tablet by mouth 2 times daily 07/25/21 01/21/22 Yes Lunna Vogelgesang, DO   gabapentin (NEURONTIN) 300 MG capsule TAKE 1 CAPSULE BY MOUTH 3 TIMES  DAILY . MAXIMUM DAILY DOSE:  900MG  07/12/21 08/16/21 Yes 08/18/21, MD   loratadine (CLARITIN) 10 MG capsule   daily, 0 Refill(s) 04/16/19  Yes Historical Provider, MD   Multiple Vitamin (MULTIVITAMIN PO) Take 1 tablet by mouth daily   Yes Historical Provider, MD   Polyethylene Glycol 3350 (MIRALAX PO) Take by mouth   Yes Historical Provider, MD   acetaminophen  (TYLENOL) 500 MG tablet Take 2 tablets by mouth every 6 hours as needed   Yes Ar Automatic Reconciliation   buprenorphine 20 MCG/HR PTWK Place 1 patch onto the skin every 7 days. Max Daily Amount: 1 patch   Yes Ar Automatic Reconciliation   docusate (COLACE, DULCOLAX) 100 MG CAPS Take 100 mg by mouth 2 times daily   Yes Ar Automatic Reconciliation   DULoxetine (CYMBALTA) 20 MG extended release capsule Take 1 capsule by mouth 2 times daily 04/20/21  Yes Ar Automatic Reconciliation   ibuprofen (ADVIL;MOTRIN) 200 MG tablet Take 3 tablets by mouth every 6 hours as needed   Yes Ar Automatic Reconciliation       Review of Systems:    General, constitutional: negative  Eyes, vision: negative  Ears, nose, throat: negative  Cardiovascular, heart: negative  Respiratory: negative  Gastrointestinal: negative  Genitourinary: negative  Musculoskeletal: negative  Skin and integumentary: negative  Psychiatric: negative  Endocrine: negative  Neurological: negative, except for HPI  Hematologic/lymphatic: negative  Allergy/immunology: negative    [] Unable to obtain  ROS due to  [] mental status change  [] sedated   [] intubated    Objective:     There were no vitals taken for this visit.    Physical Exam:  General:  appears well nourished in no acute distress  Neck: no obvious deformity or masses  Lungs: comfortable on room air  Heart:  well-perfused   Lower extremity: no edema  Skin: intact    Neurological exam:  Awake, alert, oriented to person, knows that she is at the doctor's office, states that she lives in [place], unable to tell me the month or year.  Attention and concentration were intact  Language was intact.  There was no aphasia  Speech: no dysarthria  Fund of knowledge was preserved    Cranial nerves:   II-XII were tested    PERRRLA  Visual fields were full to finger counting   EOMI, no evidence of nystagmus  Facial sensation:  normal and symmetric  Facial motor: normal and symmetric  Hearing intact  SCM strength  intact  Tongue: midline without fasciculations    Motor:   Tone normal in upper and lower extremities     Strength testing:   deltoid triceps biceps Wrist ext. Wrist flex. intrinsics Hip flex. Hip ext. Knee ext.  Knee flex  Dorsi flex Plantar flex   Right 5 5 5 5 5 5 5  NT 5 5 5 5    Left 5 5 5 5 5 5 5  NT 5 5 5 5        Sensory:  Intact to LT throughout     Reflexes:    Right Left  Biceps  2 2  Triceps  2 2  Brachiorad. 2 2  Patella  2 2  Achilles 2 2    Plantar response:  flexor bilaterally    Cerebellar testing:  no tremor apparent, finger/nose and rapid alternating movements were intact      Gait: steady.     Labs:     Lab Results   Component Value Date/Time    NA 140 05/26/2021 05:12 AM    K 4.0 05/26/2021 05:12 AM    CL 114 05/26/2021 05:12 AM    BUN 12 05/26/2021 05:12 AM    WBC 6.4 05/26/2021 05:12 AM    HCT 38.1 05/26/2021 05:12 AM    HGB 11.5 05/26/2021 05:12 AM    PLT 198 05/26/2021 05:12 AM       Imaging:  Reviewed.            Patient Active Problem List   Diagnosis    Hyperlipidemia, unspecified hyperlipidemia type    Constipation, unspecified constipation type    Chronic pain syndrome    Dementia, unspecified dementia severity, unspecified dementia type, unspecified whether behavioral, psychotic, or mood disturbance or anxiety (HCC)    Polymyalgia rheumatica (HCC)    Major depressive disorder, recurrent, mild (HCC)    Major depressive disorder, recurrent, moderate (HCC)    Major depressive disorder, recurrent, unspecified (HCC)    Other long term (current) drug therapy    Overdose                   Signed By:   07/26/2021, DO  Neurophysiology      August 16, 2021

## 2021-09-14 MED ORDER — DULOXETINE HCL 20 MG PO CPEP
20 MG | ORAL_CAPSULE | Freq: Two times a day (BID) | ORAL | 1 refills | Status: DC
Start: 2021-09-14 — End: 2023-05-25

## 2021-09-14 NOTE — Telephone Encounter (Signed)
Last OV was 08/16/21.  Last filled 04/20/21 # 180 with 1 refill

## 2021-12-01 ENCOUNTER — Ambulatory Visit
Admit: 2021-12-01 | Discharge: 2022-02-15 | Payer: MEDICARE | Attending: Student in an Organized Health Care Education/Training Program | Primary: Student in an Organized Health Care Education/Training Program

## 2021-12-01 DIAGNOSIS — Z0000 Encounter for general adult medical examination without abnormal findings: Secondary | ICD-10-CM

## 2021-12-01 NOTE — Progress Notes (Signed)
HISTORY OF PRESENT ILLNESS   Zoe Martinez is a 81 y.o. female who  has a past medical history of Arthritis, Dementia (HCC), GERD (gastroesophageal reflux disease), SBO (small bowel obstruction) (HCC), and Skin cancer.     Pt presents for 6 mo f/up.    HTN: RECALL that had been holding off on treatment for now given age, dementia, recently hypotension and syncope.    Dementia, likely alzheimer's: RECALL that she is following with neurology, on memantine and donepezil.  Sundowning was improved with melatonin and adjustment of husbands sleep schedule to be in line with hers.  - TODAY family reports that she has not had issues with agitation or sundowning as much over the last 6 months. It has been more difficult to get her on a regular sleep schedule though.      Chronic pain: neck and back pain due to herniated disc. RECALL that she is working with Dr. Sallyanne Kuster, currently on gabapentin 300 mg tid, buprenorphine patch, duloxetine 20 mg daily, ibuprofen 200 mg q6h prn. She is taking ibuprofen up to twice daily. Counseled on limiting use as much as possible.     GERD: improved with famotidine but recently sx have worsened off the medication, will refill it for now though sx may improve off of prednisone.     Constipation: miralax and colase     Pelvic organ prolapse/Cystocele: pessary placed     Hx of basal cell carcinoma/actinic keratosis: previously on tretinoin 0.025% cream.     Atrial fibrillation: s/p ablation at [facility] about 10 years ago.     HM:   - Jehovah's Witness: refuses blood transfusions  - refuses flu shot due to past reaction.   - obtained covid booster fall 2023.        SOCIAL HISTORY:    BP 117/63   Pulse 65   Temp 97.1 F (36.2 C) (Temporal)   Resp 12   Ht 1.57 m (5' 1.81")   Wt 52.1 kg (114 lb 12.8 oz)   SpO2 95%   BMI 21.13 kg/m     Physical Exam  Constitutional:       General: She is not in acute distress.     Appearance: Normal appearance. She is not toxic-appearing.   HENT:      Head:  Normocephalic and atraumatic.      Mouth/Throat:      Mouth: Mucous membranes are moist.   Eyes:      Extraocular Movements: Extraocular movements intact.   Cardiovascular:      Rate and Rhythm: Normal rate and regular rhythm.      Heart sounds: No murmur heard.     No friction rub. No gallop.   Pulmonary:      Effort: Pulmonary effort is normal.      Breath sounds: Normal breath sounds. No wheezing, rhonchi or rales.   Abdominal:      General: Bowel sounds are normal.      Palpations: Abdomen is soft.      Tenderness: There is no abdominal tenderness.   Musculoskeletal:      Right lower leg: No edema.      Left lower leg: No edema.   Skin:     General: Skin is warm and dry.   Neurological:      General: No focal deficit present.      Mental Status: She is alert.            Current Outpatient Medications:  DULoxetine (CYMBALTA) 20 MG extended release capsule, Take 1 capsule by mouth 2 times daily, Disp: 180 capsule, Rfl: 1    donepezil (ARICEPT) 10 MG tablet, Take 1 tablet by mouth nightly, Disp: 90 tablet, Rfl: 1    memantine (NAMENDA) 10 MG tablet, Take 1 tablet by mouth 2 times daily, Disp: 180 tablet, Rfl: 1    gabapentin (NEURONTIN) 300 MG capsule, TAKE 1 CAPSULE BY MOUTH 3 TIMES  DAILY . MAXIMUM DAILY DOSE:  900MG , Disp: 90 capsule, Rfl: 5    loratadine (CLARITIN) 10 MG capsule,  daily, 0 Refill(s), Disp: , Rfl:     Multiple Vitamin (MULTIVITAMIN PO), Take 1 tablet by mouth daily, Disp: , Rfl:     Polyethylene Glycol 3350 (MIRALAX PO), Take by mouth, Disp: , Rfl:     acetaminophen (TYLENOL) 500 MG tablet, Take 2 tablets by mouth every 6 hours as needed, Disp: , Rfl:     buprenorphine 20 MCG/HR PTWK, Place 1 patch onto the skin every 7 days. Max Daily Amount: 1 patch, Disp: , Rfl:     docusate (COLACE, DULCOLAX) 100 MG CAPS, Take 100 mg by mouth 2 times daily, Disp: , Rfl:     ibuprofen (ADVIL;MOTRIN) 200 MG tablet, Take 3 tablets by mouth every 6 hours as needed, Disp: , Rfl:     LABS:       ASSESSMENT and  PLAN  1. Medicare annual wellness visit, subsequent  2. Prediabetes  -     Hemoglobin A1C; Future  3. Post-menopausal  -     DEXA BONE DENSITY AXIAL SKELETON; Future  4. Gastroesophageal reflux disease without esophagitis  -     CBC with Auto Differential; Future  5. Primary hypertension  -     Comprehensive Metabolic Panel; Future  6. Encounter for screening for malignant neoplasm of skin  -     External Referral To Dermatology       Follow up:  Return in about 6 months (around 06/01/2022) for routine follow up.    08/01/2022, MD    Medicare Annual Wellness Visit    Zoe Martinez is here for Medicare AWV    Assessment & Plan   Medicare annual wellness visit, subsequent  Recommendations for Preventive Services Due: see orders and patient instructions/AVS.  Recommended screening schedule for the next 5-10 years is provided to the patient in written form: see Patient Instructions/AVS.     No follow-ups on file.     Subjective       Patient's complete Health Risk Assessment and screening values have been reviewed and are found in Flowsheets. The following problems were reviewed today and where indicated follow up appointments were made and/or referrals ordered.    No Positive Risk Factors identified today.                    Opioid Risk: (Low risk score <55) Opioid risk score: 13    Patient is low risk for opioid use disorder or overdose.  Last PDMP Shauna Hugh as Reviewed:  Review User Review Instant Review Result                                    Objective   Vitals:    12/01/21 0827   BP: 117/63   Pulse: 65   Resp: 12   Temp: 97.1 F (36.2 C)   TempSrc: Temporal   SpO2:  95%   Weight: 52.1 kg (114 lb 12.8 oz)   Height: 1.57 m (5' 1.81")      Body mass index is 21.13 kg/m.               Allergies   Allergen Reactions    Flecainide Anaphylaxis    Iodinated Contrast Media Shortness Of Breath     Uncoded Allergy. Allergen: ASPARATANE  Uncoded Allergy. Allergen: ASPARATANE  Uncoded Allergy. Allergen: ASPARATANE  Uncoded  Allergy. Allergen: ASPARATANE      Penicillins Shortness Of Breath    Simvastatin Other (See Comments)     Other Reaction: muscle cramps  Other reaction(s): Other (See Comments)  Other Reaction: muscle cramps  Other Reaction: muscle cramps  Other Reaction: muscle cramps      Other Other (See Comments)     Flu vaccine     Prior to Visit Medications    Medication Sig Taking? Authorizing Provider   gabapentin (NEURONTIN) 300 MG capsule Take 1 capsule by mouth 3 times daily. Max Daily Amount: 900 mg Yes [provider]   famotidine (PEPCID) 20 MG tablet Take 2 tablets by mouth daily Yes [provider]   melatonin 3 MG TABS tablet Take 1 tablet by mouth nightly as needed Yes [provider]   DULoxetine (CYMBALTA) 20 MG extended release capsule Take 1 capsule by mouth 2 times daily Yes Ghaffari, Leila, DO   donepezil (ARICEPT) 10 MG tablet Take 1 tablet by mouth nightly Yes Ghaffari, Leila, DO   memantine (NAMENDA) 10 MG tablet Take 1 tablet by mouth 2 times daily Yes Ghaffari, Leila, DO   loratadine (CLARITIN) 10 MG capsule   daily, 0 Refill(s) Yes [provider]   Multiple Vitamin (MULTIVITAMIN PO) Take 1 tablet by mouth daily Yes [provider]   Polyethylene Glycol 3350 (MIRALAX PO) Take by mouth Yes [provider]   acetaminophen (TYLENOL) 500 MG tablet Take 2 tablets by mouth every 6 hours as needed Yes Automatic Reconciliation, Ar   docusate (COLACE, DULCOLAX) 100 MG CAPS Take 100 mg by mouth 2 times daily Yes Automatic Reconciliation, Ar   ibuprofen (ADVIL;MOTRIN) 200 MG tablet Take 3 tablets by mouth every 6 hours as needed Yes Automatic Reconciliation, Ar   gabapentin (NEURONTIN) 300 MG capsule TAKE 1 CAPSULE BY MOUTH 3 TIMES  DAILY . MAXIMUM DAILY DOSE:  900MG   Joseph Art, MD   buprenorphine 20 MCG/HR PTWK Place 1 patch onto the skin every 7 days. Max Daily Amount: 1 patch  Patient not taking: Reported on 12/01/2021  Automatic Reconciliation,  Ar       CareTeam (Including outside providers/suppliers regularly involved in providing care):   Patient Care Team:  Joseph Art, MD as PCP - General  Talbert Nan Suann Larry, MD as PCP - Empaneled Provider     Reviewed and updated this visit:  Tobacco  Allergies  Meds  Med Hx  Surg Hx  Soc Hx  Fam Hx

## 2021-12-01 NOTE — Patient Instructions (Addendum)
Obtain second dose of shingles vaccine (Shingrix) at your pharmacy.      A Healthy Heart: Care Instructions  Your Care Instructions     Coronary artery disease, also called heart disease, occurs when a substance called plaque builds up in the vessels that supply oxygen-rich blood to your heart muscle. This can narrow the blood vessels and reduce blood flow. A heart attack happens when blood flow is completely blocked. A high-fat diet, smoking, and other factors increase the risk of heart disease.  Your doctor has found that you have a chance of having heart disease. You can do lots of things to keep your heart healthy. It may not be easy, but you can change your diet, exercise more, and quit smoking. These steps really work to lower your chance of heart disease.  Follow-up care is a key part of your treatment and safety. Be sure to make and go to all appointments, and call your doctor if you are having problems. It's also a good idea to know your test results and keep a list of the medicines you take.  How can you care for yourself at home?  Diet    Use less salt when you cook and eat. This helps lower your blood pressure. Taste food before salting. Add only a little salt when you think you need it. With time, your taste buds will adjust to less salt.     Eat fewer snack items, fast foods, canned soups, and other high-salt, high-fat, processed foods.     Read food labels and try to avoid saturated and trans fats. They increase your risk of heart disease by raising cholesterol levels.     Limit the amount of solid fat-butter, margarine, and shortening-you eat. Use olive, peanut, or canola oil when you cook. Bake, broil, and steam foods instead of frying them.     Eat a variety of fruit and vegetables every day. Dark green, deep orange, red, or yellow fruits and vegetables are especially good for you. Examples include spinach, carrots, peaches, and berries.     Foods high in fiber can reduce your cholesterol and  provide important vitamins and minerals. High-fiber foods include whole-grain cereals and breads, oatmeal, beans, brown rice, citrus fruits, and apples.     Eat lean proteins. Heart-healthy proteins include seafood, lean meats and poultry, eggs, beans, peas, nuts, seeds, and soy products.     Limit drinks and foods with added sugar. These include candy, desserts, and soda pop.   Lifestyle changes    If your doctor recommends it, get more exercise. Walking is a good choice. Bit by bit, increase the amount you walk every day. Try for at least 30 minutes on most days of the week. You also may want to swim, bike, or do other activities.     Do not smoke. If you need help quitting, talk to your doctor about stop-smoking programs and medicines. These can increase your chances of quitting for good. Quitting smoking may be the most important step you can take to protect your heart. It is never too late to quit.     Limit alcohol to 2 drinks a day for men and 1 drink a day for women. Too much alcohol can cause health problems.     Manage other health problems such as diabetes, high blood pressure, and high cholesterol. If you think you may have a problem with alcohol or drug use, talk to your doctor.   Medicines    Take  your medicines exactly as prescribed. Call your doctor if you think you are having a problem with your medicine.     If your doctor recommends aspirin, take the amount directed each day. Make sure you take aspirin and not another kind of pain reliever, such as acetaminophen (Tylenol).   When should you call for help?   Call 911 if you have symptoms of a heart attack. These may include:    Chest pain or pressure, or a strange feeling in the chest.     Sweating.     Shortness of breath.     Pain, pressure, or a strange feeling in the back, neck, jaw, or upper belly or in one or both shoulders or arms.     Lightheadedness or sudden weakness.     A fast or irregular heartbeat.   After you call 911, the operator  may tell you to chew 1 adult-strength or 2 to 4 low-dose aspirin. Wait for an ambulance. Do not try to drive yourself.  Watch closely for changes in your health, and be sure to contact your doctor if you have any problems.  Where can you learn more?  Go to https://www.bennett.info/ and enter F075 to learn more about "A Healthy Heart: Care Instructions."  Current as of: July 17, 2021               Content Version: 13.8   2006-2023 [employer].   Care instructions adapted under license by [facility]. If you have questions about a medical condition or this instruction, always ask your healthcare professional. [place] any warranty or liability for your use of this information.      Personalized Preventive Plan for Zoe Martinez - 16/[birth year]  Medicare offers a range of preventive health benefits. Some of the tests and screenings are paid in full while other may be subject to a deductible, co-insurance, and/or copay.    Some of these benefits include a comprehensive review of your medical history including lifestyle, illnesses that may run in your family, and various assessments and screenings as appropriate.    After reviewing your medical record and screening and assessments performed today your provider may have ordered immunizations, labs, imaging, and/or referrals for you.  A list of these orders (if applicable) as well as your Preventive Care list are included within your After Visit Summary for your review.    Other Preventive Recommendations:    A preventive eye exam performed by an eye specialist is recommended every 1-2 years to screen for glaucoma; cataracts, macular degeneration, and other eye disorders.  A preventive dental visit is recommended every 6 months.  Try to get at least 150 minutes of exercise per week or 10,000 steps per day on a pedometer .  Order or download the FREE "Exercise & Physical Activity: Your Everyday Guide" from [employer]. Call 718-198-9246 or search [employer] online.  You need 1200-1500 mg of calcium and 1000-2000 IU of vitamin D per day. It is possible to meet your calcium requirement with diet alone, but a vitamin D supplement is usually necessary to meet this goal.  When exposed to the sun, use a sunscreen that protects against both UVA and UVB radiation with an SPF of 30 or greater. Reapply every 2 to 3 hours or after sweating, drying off with a towel, or swimming.  Always wear a seat belt when traveling in a car. Always wear a helmet when riding a  bicycle or motorcycle.

## 2021-12-02 LAB — CBC WITH AUTO DIFFERENTIAL
Absolute Immature Granulocyte: 0 10*3/uL (ref 0.00–0.04)
Basophils %: 1 % (ref 0–1)
Basophils Absolute: 0.1 10*3/uL (ref 0.0–0.1)
Eosinophils %: 4 % (ref 0–7)
Eosinophils Absolute: 0.2 10*3/uL (ref 0.0–0.4)
Hematocrit: 38.5 % (ref 35.0–47.0)
Hemoglobin: 11.5 g/dL (ref 11.5–16.0)
Immature Granulocytes: 0 % (ref 0.0–0.5)
Lymphocytes %: 30 % (ref 12–49)
Lymphocytes Absolute: 1.6 10*3/uL (ref 0.8–3.5)
MCH: 28.8 PG (ref 26.0–34.0)
MCHC: 29.9 g/dL — ABNORMAL LOW (ref 30.0–36.5)
MCV: 96.5 FL (ref 80.0–99.0)
MPV: 11.4 FL (ref 8.9–12.9)
Monocytes %: 11 % (ref 5–13)
Monocytes Absolute: 0.6 10*3/uL (ref 0.0–1.0)
Neutrophils %: 54 % (ref 32–75)
Neutrophils Absolute: 3 10*3/uL (ref 1.8–8.0)
Nucleated RBCs: 0 PER 100 WBC
Platelets: 260 10*3/uL (ref 150–400)
RBC: 3.99 M/uL (ref 3.80–5.20)
RDW: 12.9 % (ref 11.5–14.5)
WBC: 5.5 10*3/uL (ref 3.6–11.0)
nRBC: 0 10*3/uL (ref 0.00–0.01)

## 2021-12-02 LAB — COMPREHENSIVE METABOLIC PANEL
ALT: 23 U/L (ref 12–78)
AST: 19 U/L (ref 15–37)
Albumin/Globulin Ratio: 1.2 (ref 1.1–2.2)
Albumin: 3.6 g/dL (ref 3.5–5.0)
Alk Phosphatase: 107 U/L (ref 45–117)
Anion Gap: 4 mmol/L — ABNORMAL LOW (ref 5–15)
BUN: 21 MG/DL — ABNORMAL HIGH (ref 6–20)
Bun/Cre Ratio: 26 — ABNORMAL HIGH (ref 12–20)
CO2: 29 mmol/L (ref 21–32)
Calcium: 9 MG/DL (ref 8.5–10.1)
Chloride: 109 mmol/L — ABNORMAL HIGH (ref 97–108)
Creatinine: 0.8 MG/DL (ref 0.55–1.02)
Est, Glom Filt Rate: >60 mL/min/{1.73_m2} (ref >60)
Globulin: 2.9 g/dL (ref 2.0–4.0)
Glucose: 88 mg/dL (ref 65–100)
Potassium: 4.9 mmol/L (ref 3.5–5.1)
Sodium: 142 mmol/L (ref 136–145)
Total Bilirubin: 0.2 MG/DL (ref 0.2–1.0)
Total Protein: 6.5 g/dL (ref 6.4–8.2)

## 2021-12-02 LAB — HEMOGLOBIN A1C
Hemoglobin A1C: 5.8 % — ABNORMAL HIGH (ref 4.0–5.6)
eAG: 120 mg/dL

## 2021-12-08 NOTE — Other (Signed)
Lab work is stable.

## 2021-12-12 ENCOUNTER — Encounter

## 2021-12-13 MED ORDER — GABAPENTIN 300 MG PO CAPS
300 MG | ORAL_CAPSULE | Freq: Three times a day (TID) | ORAL | 2 refills | Status: DC
Start: 2021-12-13 — End: 2022-03-06

## 2021-12-13 NOTE — Telephone Encounter (Signed)
PCP: Joseph Art, MD    Last appt: 12/01/2021  Future Appointments   Date Time Provider [place]   03/06/2022  3:00 PM Nowlin, Ballard Russell, APRN - NP [facility] AMB   06/01/2022  7:45 AM Joseph Art, MD [facility] BS AMB       Requested Prescriptions     Pending Prescriptions Disp Refills    gabapentin (NEURONTIN) 300 MG capsule 90 capsule 2     Sig: Take 1 capsule by mouth 3 times daily for 90 days. Max Daily Amount: 900 mg

## 2021-12-13 NOTE — Telephone Encounter (Signed)
From: Thayer Ohm  To: Dr. Joseph Art  Sent: 12/12/2021 5:40 PM EST  Subject: Rx - Gabapentin    Hello Dr. Talbert Nan,     Mom's refills for gabapentin 300 mg cap expired on 12/07/21. Could you please submit for more refills to [facility]?     Thank you so much!   [place]

## 2022-01-09 ENCOUNTER — Encounter

## 2022-01-09 NOTE — Telephone Encounter (Signed)
Received fax from [facility]. Last OV was 08/16/21 - next scheduled for 03/06/21. Last refilled on 08/16/21 # 90 with 1 refill

## 2022-01-10 MED ORDER — MEMANTINE HCL 10 MG PO TABS
10 MG | ORAL_TABLET | Freq: Two times a day (BID) | ORAL | 1 refills | Status: DC
Start: 2022-01-10 — End: 2022-03-06

## 2022-01-10 MED ORDER — DONEPEZIL HCL 10 MG PO TABS
10 MG | ORAL_TABLET | Freq: Every evening | ORAL | 1 refills | Status: DC
Start: 2022-01-10 — End: 2022-03-06

## 2022-02-22 ENCOUNTER — Inpatient Hospital Stay
Admit: 2022-02-22 | Payer: MEDICARE | Attending: Student in an Organized Health Care Education/Training Program | Primary: Student in an Organized Health Care Education/Training Program

## 2022-02-22 DIAGNOSIS — Z780 Asymptomatic menopausal state: Secondary | ICD-10-CM

## 2022-03-06 ENCOUNTER — Ambulatory Visit
Admit: 2022-03-06 | Payer: MEDICARE | Attending: Family | Primary: Student in an Organized Health Care Education/Training Program

## 2022-03-06 DIAGNOSIS — F03B0 Unspecified dementia, moderate, without behavioral disturbance, psychotic disturbance, mood disturbance, and anxiety: Secondary | ICD-10-CM

## 2022-03-06 MED ORDER — MEMANTINE HCL 10 MG PO TABS
10 MG | ORAL_TABLET | Freq: Two times a day (BID) | ORAL | 1 refills | Status: AC
Start: 2022-03-06 — End: 2022-09-02

## 2022-03-06 MED ORDER — DULOXETINE HCL 20 MG PO CPEP
20 MG | ORAL_CAPSULE | Freq: Two times a day (BID) | ORAL | 3 refills | Status: AC
Start: 2022-03-06 — End: 2023-03-08

## 2022-03-06 MED ORDER — DONEPEZIL HCL 10 MG PO TABS
10 MG | ORAL_TABLET | Freq: Every evening | ORAL | 1 refills | Status: AC
Start: 2022-03-06 — End: 2022-08-23

## 2022-03-06 MED ORDER — GABAPENTIN 300 MG PO CAPS
300 MG | ORAL_CAPSULE | Freq: Three times a day (TID) | ORAL | 2 refills | Status: AC
Start: 2022-03-06 — End: 2022-06-04

## 2022-03-06 NOTE — Progress Notes (Signed)
Chief Complaint   Patient presents with    Dementia     Follow up -denies worsening memory issues      1. Have you been to the ER, urgent care clinic since your last visit?  Hospitalized since your last visit? No     2. Have you seen or consulted any other health care providers outside of the [place] since your last visit?  Include any pap smears or colon screening.  Yes Dr Daryel November for RF ablation

## 2022-03-06 NOTE — Progress Notes (Signed)
[facility]  [address]  [address]  [place], [place] [address]  Tel: [phone]  Fax: [phone]      Date:  03/06/22     Name:  Zoe Martinez  DOB:  03/20/1940  MRN:  [id number]     PCP:  Gillian Scarce, MD    Chief Complaint   Patient presents with    Dementia     Follow up -denies worsening memory issues        HISTORY OF PRESENT ILLNESS:  Patient presents today for regular follow up, last seen 07/2021 with Dr Sandra Cockayne. Here today with Jomarie Longs,   Notes some lower back pain, sees Dr Sallyanne Kuster, monthly to help with manages, RFA in the past.  Lives next door to her son.   Family help with meals, her and her husband will prepare simple meals, take out.  Good appetite,  No trouble swallowing.  Tries to drink enough water, could drink more.  Not great about remembering when she ate last, but her husband helps remind her to eat.   Dresses self, needs some prompting to shower or bath.  No bad dreams, or hallucinations. Will get up and do some activities in the middle of the night, more frequent now, but then will stay in  bed late morning/early afternoon  Meds in pill box. Used a chair recently to get them off the counter recently, family plans to look into another system for her medications. Has previously taken her husband's meds. She hides things.   PCP Dr Francesca Jewett regular follow-ups  Namenda 10mg  BID: Taking as prescribed  Aricept 10mg  daily: Taking as prescribed  Brain MRI completed in , per previous notes.  I do not have these records to review.  Neuropsych testing has not been completed, patient's son does ask about the Leqembi infusions.      Recap from last visit :   Assessment and Plan:  82 year old female presenting with dementia on Aricept and Namenda.  Dementia most consistent with Alzheimer's disease.  Discussed with patient's family that we could send a referral to Dr. Edmonston however at this time they would like to hold off.  We will continue Aricept 10 mg at  bedtime, Namenda 10 mg twice daily.  Patient has had a brain MRI without contrast performed in the past, still do not have report from family.     Labs including B12, folate, MMA and TSH were unremarkable.     Recommendations:  - Continue Aricept and Namenda        Problem was discussed at length with the patient.  We reviewed the results of the study in detail.  We also discussed prognosis and treatment options.  The patient had opportunity today to ask all questions, expressed understanding of the instructions provided, and agreed with the plan of treatment.          REVIEW OF SYSTEMS:     Review of Systems   HENT:  Negative for hearing loss.    Eyes: Negative.    Genitourinary:         Wears a depends   Musculoskeletal:  Positive for back pain. Negative for gait problem.   Neurological:  Negative for dizziness, seizures, numbness and headaches.   Psychiatric/Behavioral:  Positive for confusion, decreased concentration and sleep disturbance. Negative for agitation, behavioral problems and hallucinations.    All other systems reviewed and are negative.          Current Outpatient Medications  Medication Sig    donepezil (ARICEPT) 10 MG tablet Take 1 tablet by mouth nightly    memantine (NAMENDA) 10 MG tablet Take 1 tablet by mouth 2 times daily    gabapentin (NEURONTIN) 300 MG capsule Take 1 capsule by mouth 3 times daily for 90 days. Max Daily Amount: 900 mg    famotidine (PEPCID) 20 MG tablet Take 2 tablets by mouth daily    melatonin 3 MG TABS tablet Take 1 tablet by mouth nightly as needed    DULoxetine (CYMBALTA) 20 MG extended release capsule Take 1 capsule by mouth 2 times daily    loratadine (CLARITIN) 10 MG capsule   daily, 0 Refill(s)    Multiple Vitamin (MULTIVITAMIN PO) Take 1 tablet by mouth daily    Polyethylene Glycol 3350 (MIRALAX PO) Take by mouth    acetaminophen (TYLENOL) 500 MG tablet Take 2 tablets by mouth every 6 hours as needed    buprenorphine (BUPRENEX) 15 MCG/HR PTWK Place 1 patch onto  the skin every 7 days.    docusate (COLACE, DULCOLAX) 100 MG CAPS Take 100 mg by mouth 2 times daily    ibuprofen (ADVIL;MOTRIN) 200 MG tablet Take 3 tablets by mouth every 6 hours as needed     No current facility-administered medications for this visit.     Allergies   Allergen Reactions    Flecainide Anaphylaxis    Iodinated Contrast Media Shortness Of Breath     Uncoded Allergy. Allergen: ASPARATANE  Uncoded Allergy. Allergen: ASPARATANE  Uncoded Allergy. Allergen: ASPARATANE  Uncoded Allergy. 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 No pertinent family history.       PHYSICAL EXAMINATION:    Vitals:    03/06/22 1502   BP: 134/60   Site: Left Upper Arm   Position: Sitting   Cuff Size: Small Adult   Pulse: 72   Resp: 18   Temp: 98.5 F (36.9 C)   TempSrc: Temporal   SpO2: 99%   Weight: 53.3 kg (117 lb 6.4 oz)   Height: 1.549 m (5\' 1" )       General:  Well defined, nourished, and well groomed individual in no acute distress.   Musculoskeletal:  Extremities revealed no edema and had full range of motion of joints.    Psych:  Good mood and bright affect, with her son.    NEUROLOGICAL EXAMINATION:  Mental Status:   Alert and oriented to person,  place, she was unable to name the month, noted the year to be 2015.  Attention span and concentration are somewhat limited. Clear speech.  0/3 word recall, patient was able to spell world correctly backwards.  Clock test patient was unable to complete.    Cranial Nerves:   PERRLA.  Visual fields were full  EOM: no evidence of nystagmus  Facial sensation:  normal and symmetric  Facial motor: normal and symmetric, no facial droop noted.  Hearing intact  SCM strength intact  Tongue: midline without fasciculations    Motor Examination: Normal tone. 5/5 muscle strength in bilateral upper extremities. No cogwheel rigidity.  No muscle wasting, no twitching or fasciculation noted.      Sensory exam:  Normal throughout to light touch in bilateral upper extremities.    Coordination:   Finger to nose and rapid arm movement testing was normal.  No resting or intention tremor.  Negative Romberg, negative pronator drift.      Gait and Station: Relatively steady gait.  Normal arm swing.      Reflexes:  DTRs 2+ in bilateral biceps, brachioradialis, patella and ankle.  No clonus noted.    ASSESSMENT AND PLAN     Diagnosis Orders   1. Moderate dementia without behavioral disturbance, psychotic disturbance, mood disturbance, or anxiety, unspecified dementia type (HCC)  memantine (NAMENDA) 10 MG tablet    donepezil (ARICEPT) 10 MG tablet      2. DDD (degenerative disc disease), lumbar  gabapentin (NEURONTIN) 300 MG capsule      3. Chronic pain syndrome        4. Sleep disturbance          1.  Moderate dementia without behavioral disturbance: For the time being patient is to continue the Aricept 10 mg daily as well as Namenda 10 mg twice a day.  I encouraged the patient and her family to explore the Alzheimer's Association website as well as the [place] website to gain more knowledge in regards to these infusions.  Advised we would need updated imaging, which includes brain MRI as well as amyloid PET scan as well as neuropsych testing.   Advised if this is some they would like to pursue and she qualifies I would defer management to Dr. Tiana Loft.  There has been recent safety concerns noted in regards to medication issue, family plans to address. I discussed with the patient and family members in regards to the patient's cognitive limitations and need to ensure patient safety.  Attempts to minimize opportunities of harm is important.   Activities to be monitored, or avoided, would include cooking, ironing clothes, financial bill payments.   Having structure to a day is important for the patient.  Cognitive and physical activities should be considered daily.   2.  Lumbar DDD: Patient is continue with regular follow-up with Dr. Daryel November, I have provided the patient with a refill of her gabapentin 300 mg take 1 3 times a day as well as Cymbalta 20 mg twice a day.   Safety and side effects medication discussed.  3.  Chronic pain syndrome: Continue to follow-up with pain management for medications as prescribed.  4.  Sleep disturbance: Sleep hygiene encouraged, discussed possibly retrying melatonin to help with better sleep-wake cycle.    Patient and/or family verbalized understand of all instructions and all questions/concerns were addressed.  Safety/side effects of medications discussed.    Patient remains a complex patient secondary  to polypharmacy, significant comorbid conditions, and use of high-risk medications which complicate the decision making process related to patient's neurologic diagnosis.    The patient is to follow up in 6 months, sooner if needed.    Reina Fuse, FNP-BC

## 2022-03-08 ENCOUNTER — Encounter

## 2022-03-14 ENCOUNTER — Encounter

## 2022-03-14 NOTE — Telephone Encounter (Signed)
From: Thayer Ohm  To: Dr. Joseph Art  Sent: 03/08/2022 1:11 PM EST  Subject: Prolio Injection for Mom    Hello Dr. Talbert Nan,     Thank you for your letter of recommendation for the dexa bone scan for [facility]. We would like to proceed with the injection. What are the next steps?     Thank you,   Inez Catalina & Wayne Both

## 2022-04-17 ENCOUNTER — Encounter

## 2022-04-17 ENCOUNTER — Inpatient Hospital Stay: Admit: 2022-04-17 | Payer: MEDICARE | Primary: Student in an Organized Health Care Education/Training Program

## 2022-04-17 DIAGNOSIS — M810 Age-related osteoporosis without current pathological fracture: Secondary | ICD-10-CM

## 2022-04-17 MED ORDER — DENOSUMAB 60 MG/ML SC SOSY
60 | Freq: Once | SUBCUTANEOUS | Status: DC
Start: 2022-04-17 — End: 2022-04-17

## 2022-04-17 NOTE — Telephone Encounter (Signed)
Tooth extraction on 3/8 which is an interaction with getting prolia injection. Advised infusion center that after speaking to On call about situation and then on call advised "as of right now have pt cancel appt. We will do some more research due to situation and we will contact pt with correct timeframe."    Please advise correct timeframe so we can get pt rescheduled for prolia injection.

## 2022-04-17 NOTE — Progress Notes (Signed)
[facility] Visit Note    Pt arrived to [facility] ambulatory in no acute distress at 1500 for first dose Prolia.  Assessment unremarkable except SOB with exertion. Patient reports having a tooth extracted 03/31/22. She has not had a follow up. This RN called Dr.Irwin's office and the nurse spoke with Dr.Mackenzie who was on call. Per Dr. Noah Delaine, hold treatment for today. Their office will contact the patient with how long she needs to wait before rescheduling appointment and she will contact us to reschedule. Relayed this information to patient and her son. They both verbalized understanding.   Patient denied having any symptoms of COVID-19, i.e. SOB, coughing, fever, or generally not feeling well.      BP (!) 149/66   Pulse 72   Temp 97.8 F (36.6 C) (Temporal)   Resp 16   Ht 1.549 m (5\' 1" )   Wt 52.7 kg (116 lb 1.6 oz)   SpO2 95%   BMI 21.94 kg/m     Pt discharged ambulatory in no acute distress at 1520, accompanied by son.

## 2022-04-18 NOTE — Telephone Encounter (Signed)
Spoke to pt's son using two pt identifiers.  Pt's son was informed of Dr.McKenzie's response. Pt's son states an appt was made for September so they will keep that appt.

## 2022-05-16 ENCOUNTER — Encounter

## 2022-05-18 ENCOUNTER — Inpatient Hospital Stay: Admit: 2022-05-18 | Payer: MEDICARE | Primary: Student in an Organized Health Care Education/Training Program

## 2022-05-18 DIAGNOSIS — M810 Age-related osteoporosis without current pathological fracture: Secondary | ICD-10-CM

## 2022-05-18 LAB — POC CHEM 8
Anion Gap, POC: 6.4 mmol/L — ABNORMAL LOW (ref 10–20)
POC Chloride: 110 mmol/L — ABNORMAL HIGH (ref 98–107)
POC Creatinine: 0.72 mg/dL (ref 0.6–1.3)
POC Glucose: 92 mg/dL (ref 65–100)
POC Ionized Calcium: 1.29 mmol/L (ref 1.12–1.32)
POC Potassium: 4.3 mmol/L (ref 3.5–5.1)
POC Sodium: 142 mmol/L (ref 136–145)
POC TCO2: 25.6 mmol/L (ref 21–32)
eGFR, POC: 83 mL/min/{1.73_m2} (ref >60)

## 2022-05-18 MED ORDER — DENOSUMAB 60 MG/ML SC SOSY
60 | Freq: Once | SUBCUTANEOUS | Status: AC
Start: 2022-05-18 — End: 2022-05-18
  Administered 2022-05-18: 20:00:00 60 mg via SUBCUTANEOUS

## 2022-05-18 MED FILL — PROLIA 60 MG/ML SC SOSY: 60 MG/ML | SUBCUTANEOUS | Qty: 1

## 2022-05-18 NOTE — Progress Notes (Signed)
[facility] Visit Note:  Arrived ambulatory for Prolia regimen. Assessment unremarkable. Reviewed Prolia info. Pt denies any recent or upcoming dental work.    Patient denied having any symptoms of COVID-19, i.e. SOB, coughing, fever, or generally not feeling well.      BP (!) 147/73   Pulse 68   Temp 97.9 F (36.6 C) (Temporal)   Resp 16   Ht 1.549 m (5' 0.98")   Wt 52.2 kg (115 lb)   SpO2 96%   BMI 21.74 kg/m         Labs obtained:   Recent Results (from the past 12 hour(s))   POC CHEM 8    Collection Time: 05/18/22  3:31 PM   Result Value Ref Range    POC Ionized Calcium 1.29 1.12 - 1.32 mmol/L    POC Sodium 142 136 - 145 mmol/L    POC Potassium 4.3 3.5 - 5.1 mmol/L    POC Chloride 110 (H) 98 - 107 mmol/L    POC TCO2 25.6 21 - 32 mmol/L    Anion Gap, POC 6.4 (L) 10 - 20 mmol/L    POC Glucose 92 65 - 100 mg/dL    POC Creatinine 4.09 0.6 - 1.3 mg/dL    eGFR, POC 83 >81 [id number]/JYN/8.29F6    UA Comment Comment Not Indicated.         Medication given:  Prolia 60mg  SQ in left arm    Tolerated well. No reaction noted. Reviewed Prolia D/C instructions. Verbalized understanding. Pt denies any acute problems/changes. Discharged from [facility] ambulatory. No distress. Next appt: 6 months - patient aware.

## 2022-05-23 MED ORDER — FAMOTIDINE 20 MG PO TABS
20 MG | ORAL_TABLET | Freq: Two times a day (BID) | ORAL | 3 refills | Status: DC
Start: 2022-05-23 — End: 2023-06-04

## 2022-06-01 ENCOUNTER — Ambulatory Visit
Payer: MEDICARE | Attending: Student in an Organized Health Care Education/Training Program | Primary: Student in an Organized Health Care Education/Training Program

## 2022-06-16 ENCOUNTER — Encounter

## 2022-06-27 ENCOUNTER — Ambulatory Visit
Admit: 2022-06-27 | Payer: Medicare Other | Attending: Student in an Organized Health Care Education/Training Program | Primary: Student in an Organized Health Care Education/Training Program

## 2022-06-27 DIAGNOSIS — I10 Essential (primary) hypertension: Secondary | ICD-10-CM

## 2022-06-27 NOTE — Assessment & Plan Note (Signed)
Well-controlled, continue current medications

## 2022-06-27 NOTE — Progress Notes (Addendum)
HISTORY OF PRESENT ILLNESS   Zoe Martinez is a 82 y.o. female who  has a past medical history of Arthritis, Dementia (HCC), GERD (gastroesophageal reflux disease), SBO (small bowel obstruction) (HCC), and Skin cancer.     Pt presents for 6 mo f/up.    Osteoporosis:   02/24/22 dexa osteoporosis  Prolia started in March, tolerating well.     HTN: RECALL that had been holding off on treatment for now given age, dementia, recently hypotension and syncope.    Dementia, likely alzheimer's: RECALL that she is following with neurology, on memantine and donepezil.  Sundowning was improved with melatonin and adjustment of husbands sleep schedule to be in line with hers.  Previously family reported that she has not had issues with agitation or sundowning as much over the last 6 months. It has been more difficult to get her on a regular sleep schedule though.   06/27/22 Family report much improvement, w/o further episodes of sundowning or agitation or worsening confusion at night.      Chronic pain: neck and back pain due to herniated disc. RECALL that she is working with Dr. Sallyanne Kuster, currently on gabapentin 300 mg tid, buprenorphine patch, duloxetine 20 mg daily, ibuprofen 200 mg q6h prn. She is taking ibuprofen up to twice daily. Counseled on limiting use as much as possible. Son doesn't think she is taking very frequently.     GERD: improved with famotidine but recently sx have worsened off the medication, will refill it for now though sx may improve off of prednisone.     Constipation: miralax and colase     Pelvic organ prolapse/Cystocele: pessary placed     Hx of basal cell carcinoma/actinic keratosis: previously on tretinoin 0.025% cream.     Atrial fibrillation: s/p ablation at [facility] about 10 years ago.     HM:   - Jehovah's Witness: refuses blood transfusions  - refuses flu shot due to past reaction.   - obtained covid booster fall 2023.        SOCIAL HISTORY:    BP 131/63   Pulse 67   Temp 96.9 F (36.1 C) (Temporal)   Resp  16   Ht 1.549 m (5\' 1" )   Wt 50.8 kg (112 lb)   SpO2 96%   BMI 21.16 kg/m     Physical Exam  Constitutional:       General: She is not in acute distress.     Appearance: Normal appearance. She is not toxic-appearing.   HENT:      Head: Normocephalic and atraumatic.      Mouth/Throat:      Mouth: Mucous membranes are moist.   Eyes:      Extraocular Movements: Extraocular movements intact.   Cardiovascular:      Rate and Rhythm: Normal rate and regular rhythm.      Heart sounds: No murmur heard.     No friction rub. No gallop.   Pulmonary:      Effort: Pulmonary effort is normal.      Breath sounds: Normal breath sounds. No wheezing, rhonchi or rales.   Abdominal:      General: Bowel sounds are normal.      Palpations: Abdomen is soft.      Tenderness: There is no abdominal tenderness.   Musculoskeletal:      Right lower leg: No edema.      Left lower leg: No edema.   Skin:     General: Skin is warm and dry.  Neurological:      General: No focal deficit present.      Mental Status: She is alert.            Current Outpatient Medications:     famotidine (PEPCID) 20 MG tablet, TAKE 1 TABLET BY MOUTH TWICE  DAILY, Disp: 180 tablet, Rfl: 3    DULoxetine (CYMBALTA) 20 MG extended release capsule, Take 1 capsule by mouth 2 times daily, Disp: 180 capsule, Rfl: 3    memantine (NAMENDA) 10 MG tablet, Take 1 tablet by mouth 2 times daily, Disp: 180 tablet, Rfl: 1    gabapentin (NEURONTIN) 300 MG capsule, Take 1 capsule by mouth 3 times daily for 90 days. Max Daily Amount: 900 mg, Disp: 270 capsule, Rfl: 2    donepezil (ARICEPT) 10 MG tablet, Take 1 tablet by mouth nightly, Disp: 90 tablet, Rfl: 1    melatonin 3 MG TABS tablet, Take 1 tablet by mouth nightly as needed, Disp: , Rfl:     DULoxetine (CYMBALTA) 20 MG extended release capsule, Take 1 capsule by mouth 2 times daily, Disp: 180 capsule, Rfl: 1    loratadine (CLARITIN) 10 MG capsule,  daily, 0 Refill(s), Disp: , Rfl:     Multiple Vitamin (MULTIVITAMIN PO), Take 1  tablet by mouth daily, Disp: , Rfl:     Polyethylene Glycol 3350 (MIRALAX PO), Take by mouth, Disp: , Rfl:     acetaminophen (TYLENOL) 500 MG tablet, Take 2 tablets by mouth every 6 hours as needed, Disp: , Rfl:     buprenorphine (BUPRENEX) 15 MCG/HR PTWK, Place 1 patch onto the skin every 7 days., Disp: , Rfl:     docusate (COLACE, DULCOLAX) 100 MG CAPS, Take 100 mg by mouth 2 times daily, Disp: , Rfl:     ibuprofen (ADVIL;MOTRIN) 200 MG tablet, Take 3 tablets by mouth every 6 hours as needed, Disp: , Rfl:     LABS:       ASSESSMENT and PLAN  1. Primary hypertension  Assessment & Plan:  Well controlled off medication at this time.   2. Chronic pain syndrome  Assessment & Plan:   Monitored by specialist- no acute findings meriting change in the plan  3. Age-related osteoporosis without current pathological fracture  Assessment & Plan:  Recent diagnosis, started prolia in March which she is tolerating well.   4. Major depressive disorder, recurrent, moderate (HCC)  Assessment & Plan:   Well-controlled, continue current medications  5. Dementia, unspecified dementia severity, unspecified dementia type, unspecified whether behavioral, psychotic, or mood disturbance or anxiety [facility])  Assessment & Plan:   Monitored by specialist- no acute findings meriting change in the plan       Follow up:  Return in about 6 months (around 12/27/2022) for MAWV.    Gillian Scarce, MD

## 2022-06-27 NOTE — Assessment & Plan Note (Signed)
Recent diagnosis, started prolia in March which she is tolerating well.

## 2022-06-27 NOTE — Progress Notes (Signed)
"  Have you been to the ER, urgent care clinic since your last visit?  Hospitalized since your last visit?"    NO    "Have you seen or consulted any other health care providers outside of [facility] since your last visit?"    NO            Click Here for Release of Records Request

## 2022-06-27 NOTE — Assessment & Plan Note (Signed)
Well controlled off medication at this time.

## 2022-06-27 NOTE — Assessment & Plan Note (Signed)
Monitored by specialist- no acute findings meriting change in the plan

## 2022-06-30 NOTE — Telephone Encounter (Signed)
From: Shauna Hugh  Sent: 06/30/2022 2:22 PM EDT  To: [facility] 3rd Floor Clinical Staff  Subject: Shingles vaccine    Update:   Mom had her 2nd shingles vaccine 06/29/22 at [facility]. If you could add that to her MyChart, that'd be great. Thank you so much!!

## 2022-08-22 ENCOUNTER — Encounter

## 2022-08-23 ENCOUNTER — Encounter

## 2022-08-23 MED ORDER — DONEPEZIL HCL 10 MG PO TABS
10 | ORAL_TABLET | Freq: Every evening | ORAL | 3 refills | 30.00 days | Status: DC
Start: 2022-08-23 — End: 2023-07-05

## 2022-08-23 MED ORDER — MEMANTINE HCL 10 MG PO TABS
10 MG | ORAL_TABLET | Freq: Two times a day (BID) | ORAL | 3 refills | Status: DC
Start: 2022-08-23 — End: 2023-07-05

## 2022-08-23 NOTE — Telephone Encounter (Signed)
Last OV was 03/06/22.  Last filled on 03/06/22 # 90 with 1 refill

## 2022-08-23 NOTE — Telephone Encounter (Signed)
Last OV was 03/06/22. Next is scheduled for 09/04/22. Last filled on 03/06/22 # 180 with 1 refill

## 2022-09-04 ENCOUNTER — Encounter
Admit: 2022-09-04 | Payer: MEDICARE | Attending: Family | Primary: Student in an Organized Health Care Education/Training Program

## 2022-09-04 DIAGNOSIS — F03B0 Unspecified dementia, moderate, without behavioral disturbance, psychotic disturbance, mood disturbance, and anxiety: Secondary | ICD-10-CM

## 2022-09-04 MED ORDER — GABAPENTIN 300 MG PO CAPS
300 MG | ORAL_CAPSULE | Freq: Three times a day (TID) | ORAL | 2 refills | Status: DC
Start: 2022-09-04 — End: 2023-05-02

## 2022-09-04 NOTE — Progress Notes (Signed)
Chief Complaint   Patient presents with    Dementia     Follow up - per daughter in law - memory is a little worse      1. Have you been to the ER, urgent care clinic since your last visit?  Hospitalized since your last visit? No    2. Have you seen or consulted any other health care providers outside of the [facility] since your last visit?  Include any pap smears or colon screening.  No

## 2022-09-04 NOTE — Progress Notes (Signed)
[facility]  [address]  [address]  [place], [place] [address]  Tel: [phone]  Fax: [phone]      Date:  09/04/22     Name:  Zoe Martinez  DOB:  Jun 19, 1940  MRN:  [id number]     PCP:  Zoe Scarce, MD    Chief Complaint   Patient presents with    Dementia     Follow up - per daughter in law - memory is a little worse        HISTORY OF PRESENT ILLNESS:  Patient presents today for regular follow-up, last seen February 2024.  She is here today with her daughter-in-law, [name].  Per patient she is doing fairly well.  Her daughter and son feel as though her memory is a little bit worse.  Little things like: trouble with task completion, more forgetful,  Family lives next-door.  In the last few weeks, she has been out on the road . She likes doing "hard work" she likes to take care of things like leaves, picking up sticks. Not reading as much as she used to, they gave her an ipad, willing to try some games.  Family assists with meals, they have a meal delivery system too.  Patient still has a good appetite, no trouble swallowing.  Trying to push more hydration. Drinks water, some ginger ale, loves coffee, decaf at night.   Medications: uses a pill container now, daughter in law helps with that, takes meds like rx'ed.   She is still hiding things-shampoo, conditioner, clothing, paper, etc.  Namenda 10 mg twice a day: doing well.  Aricept 10 mg taking as prescribed  Primary care is Dr. Wyatt Mage Dr Zoe Martinez, notes ongoing issues with her lower back, takes the Gabapentin, she does think it is helpful. Cymbalta 20mg  BID is doing well as well.    Recap from last visit :   1.  Moderate dementia without behavioral disturbance: For the time being patient is to continue the Aricept 10 mg daily as well as Namenda 10 mg twice a day.  I encouraged the patient and her family to explore the Alzheimer's Association website as well as the Leqembi website to gain more knowledge in regards to these  infusions.  Advised we would need updated imaging, which includes brain MRI as well as amyloid PET scan as well as neuropsych testing.  Advised if this is some they would like to pursue and she qualifies I would defer management to Dr. Cherlynn Martinez.  There has been recent safety concerns noted in regards to medication issue, family plans to address. I discussed with the patient and family members in regards to the patient's cognitive limitations and need to ensure patient safety.  Attempts to minimize opportunities of harm is important.   Activities to be monitored, or avoided, would include cooking, ironing clothes, financial bill payments.   Having structure to a day is important for the patient.  Cognitive and physical activities should be considered daily.   2.  Lumbar DDD: Patient is continue with regular follow-up with Dr. Sallyanne Martinez, I have provided the patient with a refill of her gabapentin 300 mg take 1 3 times a day as well as Cymbalta 20 mg twice a day.   Safety and side effects medication discussed.  3.  Chronic pain syndrome: Continue to follow-up with pain management for medications as prescribed.  4.  Sleep disturbance: Sleep hygiene encouraged, discussed possibly retrying melatonin to help  with better sleep-wake cycle.       REVIEW OF SYSTEMS:     Review of Systems   HENT:  Negative for trouble swallowing.    Musculoskeletal:  Negative for gait problem.   Neurological:  Negative for dizziness, light-headedness and headaches.   Psychiatric/Behavioral:  Positive for confusion and decreased concentration. Negative for agitation, behavioral problems, hallucinations and sleep disturbance. The patient is not nervous/anxious.    All other systems reviewed and are negative.          Current Outpatient Medications   Medication Sig    gabapentin (NEURONTIN) 300 MG capsule Take 1 capsule by mouth 3 times daily for 90 days. Max Daily Amount: 900 mg    memantine (NAMENDA) 10 MG tablet TAKE 1 TABLET BY MOUTH TWICE  DAILY     donepezil (ARICEPT) 10 MG tablet TAKE 1 TABLET BY MOUTH EVERY  NIGHT    HYDROcodone-acetaminophen (NORCO) 5-325 MG per tablet TAKE 1 TAB BY MOUTH 1-2 TIMES DAILY AS NEEDED    famotidine (PEPCID) 20 MG tablet TAKE 1 TABLET BY MOUTH TWICE  DAILY    melatonin 3 MG TABS tablet Take 1 tablet by mouth nightly as needed    DULoxetine (CYMBALTA) 20 MG extended release capsule Take 1 capsule by mouth 2 times daily    loratadine (CLARITIN) 10 MG capsule   daily, 0 Refill(s)    Multiple Vitamin (MULTIVITAMIN PO) Take 1 tablet by mouth daily    acetaminophen (TYLENOL) 500 MG tablet Take 2 tablets by mouth every 6 hours as needed    buprenorphine (BUPRENEX) 15 MCG/HR PTWK Place 1 patch onto the skin every 7 days.    docusate (COLACE, DULCOLAX) 100 MG CAPS Take 100 mg by mouth 2 times daily    ibuprofen (ADVIL;MOTRIN) 200 MG tablet Take 3 tablets by mouth every 6 hours as needed    DULoxetine (CYMBALTA) 20 MG extended release capsule Take 1 capsule by mouth 2 times daily     No current facility-administered medications for this visit.     Allergies   Allergen Reactions    Flecainide Anaphylaxis    Iodinated Contrast Media Shortness Of Breath     Uncoded Allergy. Allergen: ASPARATANE  Uncoded Allergy. Allergen: ASPARATANE  Uncoded Allergy. Allergen: ASPARATANE  Uncoded Allergy. 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 No pertinent family history.  PHYSICAL EXAMINATION:    Vitals:    09/04/22 1426   BP: 122/60   Site: Left Upper Arm   Position: Sitting   Cuff Size: Small Adult   Pulse: 70   Resp: 18   SpO2: 94%   Weight: 48.5 kg (107 lb)   Height: 1.549 m (5\' 1" )       General:  Well defined, nourished, and well groomed individual in no acute distress.    Musculoskeletal:  Extremities revealed no edema and had full range of motion of joints.    Psych:  Good mood and bright affect, with her daughter in law.    NEUROLOGICAL EXAMINATION:     Mental Status:   Alert and oriented to person, place, able to name her daughter in law, unable to name the month or the year.  .  Attention span and concentration are somewhat limited. Clear speech. Clock test: Unable to complete  Word Recall: 0/3  Pt able to spell world backwards: no    Cranial Nerves:   PERRLA.  Visual fields were full  EOM: no evidence of nystagmus  Facial sensation:  normal and symmetric  Facial motor: normal and symmetric, no facial droop noted.  Hearing intact  SCM strength intact  Tongue: midline without fasciculations    Motor Examination: Normal tone. 5/5 muscle strength in bilateral upper and lower extremities. No cogwheel rigidity.  No muscle wasting, no twitching or fasciculation noted.      Sensory exam:  Normal throughout to light touch in bilateral upper extremities.    Coordination:   Finger to nose and rapid arm movement testing was normal.  No resting or intention tremor.  Negative Romberg, negative pronator drift.      Gait and Station: Relatively steady gait, slightly hunched forward.  Normal arm swing.      Reflexes:  DTRs 2+ in bilateral biceps, brachioradialis, patella.    ASSESSMENT AND PLAN     Diagnosis Orders   1. Moderate dementia without behavioral disturbance, psychotic disturbance, mood  disturbance, or anxiety, unspecified dementia type (HCC)        2. DDD (degenerative disc disease), lumbar  gabapentin (NEURONTIN) 300 MG capsule        1.  Moderate dementia without behavioral disturbance: Patient has not completed formal neuropsych testing, will defer referral at this time.  Advised for the time being to continue the Namenda 10 mg twice a day as well as the donepezil milligrams nightly.  Patient is still living independently with family living beside her.  Discussed safety concerns with patient wandering into the road, I have provided the patient with literature from the Alzheimer's Society to talk about different options to ensure her safety.  Safety locks, cameras etc. they have previously asked about the Leqembi infusion, advised we would need updated brain MRI, a completed neuropsych test as well as an amyloid PET scan, will defer ordering at this time.  Patient's family is to contact us if any worsening signs or symptoms at which time we can modify plan of care accordingly. I discussed with the patient and family members in regards to the patient's cognitive limitations and need to ensure patient safety.  Attempts to minimize opportunities of harm is important.   Activities to be monitored, or avoided, would include cooking, ironing clothes, financial bill payments.   Having structure to a day is important for the patient.  Cognitive and physical activities should be considered daily.     2.  Lumbar DDD: Patient  is monitored by [facility], we are managing the patient's gabapentin 300 mg 3 times a day as well as the Cymbalta 20 mg twice a day.  And side effects medication discussed continue with home exercise program and walking as tolerated.    Patient and/or family verbalized understand of all instructions and all questions/concerns were addressed.  Safety/side effects of medications discussed.    Patient remains a complex patient secondary to polypharmacy, significant comorbid  conditions, and use of high-risk medications which complicate the decision making process related to patient's neurologic diagnosis.    The patient is to follow up in 6 months, sooner if needed.    Wendall Stade, FNP-BC

## 2022-10-15 ENCOUNTER — Encounter

## 2022-10-16 ENCOUNTER — Ambulatory Visit: Payer: MEDICARE | Primary: Student in an Organized Health Care Education/Training Program

## 2022-11-16 ENCOUNTER — Inpatient Hospital Stay: Admit: 2022-11-16 | Payer: MEDICARE | Primary: Student in an Organized Health Care Education/Training Program

## 2022-11-16 DIAGNOSIS — M810 Age-related osteoporosis without current pathological fracture: Secondary | ICD-10-CM

## 2022-11-16 LAB — POC CHEM 8
Anion Gap, POC: 10.8 mmol/L (ref 10–20)
POC Chloride: 107 mmol/L (ref 98–107)
POC Creatinine: 0.78 mg/dL (ref 0.6–1.3)
POC Glucose: 105 mg/dL — ABNORMAL HIGH (ref 74–99)
POC Ionized Calcium: 1.34 mmol/L — ABNORMAL HIGH (ref 1.15–1.33)
POC Potassium: 3.8 mmol/L (ref 3.5–5.1)
POC Sodium: 145 mmol/L (ref 136–145)
POC TCO2: 27.2 mmol/L (ref 21–32)
eGFR, POC: 76 mL/min/{1.73_m2} (ref >60)

## 2022-11-16 MED ORDER — DENOSUMAB 60 MG/ML SC SOSY
60 | Freq: Once | SUBCUTANEOUS | Status: AC
Start: 2022-11-16 — End: 2022-11-16
  Administered 2022-11-16: 20:00:00 60 mg via SUBCUTANEOUS

## 2022-11-16 MED FILL — PROLIA 60 MG/ML SC SOSY: 60 MG/ML | SUBCUTANEOUS | Qty: 1

## 2022-11-16 NOTE — Progress Notes (Signed)
[facility] Visit Note:  Arrived for Prolia injection    BP (!) 160/74   Pulse 74   Temp 97.9 F (36.6 C) (Temporal)   Resp 16   Ht 1.524 m (5')   Wt 52.6 kg (116 lb)   SpO2 96%   BMI 22.65 kg/m     Assessment unremarkable. Reviewed Prolia info. Pt denies any recent or upcoming dental work.    Labs obtained:   Recent Results (from the past 12 hour(s))   POC CHEM 8    Collection Time: 11/16/22  3:24 PM   Result Value Ref Range    POC Ionized Calcium 1.34 (H) 1.15 - 1.33 mmol/L    POC Sodium 145 136 - 145 mmol/L    POC Potassium 3.8 3.5 - 5.1 mmol/L    POC Chloride 107 98 - 107 mmol/L    POC TCO2 27.2 21 - 32 mmol/L    Anion Gap, POC 10.8 10 - 20 mmol/L    POC Glucose 105 (H) 74 - 99 mg/dL    POC Creatinine 2.84 0.6 - 1.3 mg/dL    eGFR, POC 76 >13 KG/MWN/0.27O5    UA Comment Comment Not Indicated.         Medication given:  Prolia 60mg  SQ in left arm    Tolerated well. No reaction noted. Reviewed Prolia D/C instructions. Verbalized understanding. Pt denies any acute problems/changes. Discharged from [facility] ambulatory. No distress. Next appt: 6 months, patient and family aware.

## 2022-11-20 NOTE — Telephone Encounter (Signed)
Attempted to contact patient to reschedule 12/27/22 appointment. Provider out of office. Patient needs to reschedule for next available.

## 2022-12-27 ENCOUNTER — Ambulatory Visit
Payer: MEDICARE | Attending: Student in an Organized Health Care Education/Training Program | Primary: Student in an Organized Health Care Education/Training Program

## 2023-02-27 ENCOUNTER — Ambulatory Visit
Admit: 2023-02-27 | Payer: MEDICARE | Attending: Internal Medicine | Primary: Student in an Organized Health Care Education/Training Program

## 2023-02-27 VITALS — BP 154/70 | HR 68 | Temp 97.80000°F | Resp 16 | Ht 61.0 in | Wt 116.0 lb

## 2023-02-27 DIAGNOSIS — I8002 Phlebitis and thrombophlebitis of superficial vessels of left lower extremity: Secondary | ICD-10-CM

## 2023-02-27 MED ORDER — LIDOCAINE-PRILOCAINE 2.5-2.5 % EX CREA
2.5-2.5 | CUTANEOUS | 1 refills | Status: AC
Start: 2023-02-27 — End: ?

## 2023-02-27 NOTE — Progress Notes (Signed)
PROGRESS NOTE  Name: Zoe Martinez   DOB: 10-Aug-1940       ASSESSMENT/ PLAN:     Deniss was seen today for leg pain and leg swelling.    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  A handout was given.  -     lidocaine-prilocaine (EMLA) 2.5-2.5 % cream; Apply topically as needed.    Return if symptoms worsen or fail to improve.     I have reviewed the patient's medications and risks/side effects/benefits were discussed. Diagnosis(-es) explained to patient and questions answered. Literature provided where appropriate.                       SUBJECTIVE  Zoe Martinez presents today acutely for     Chief Complaint   Patient presents with    Leg Pain     States having left leg tingling 6/10 pain x 1 week.    Leg Swelling     She has had pain in her left medial distal lower extremity which has been present for about a week.  The pain is sharp and the area is exquisitely tender to palpation.  She has a little bit of discoloration there and the daughter thought she had some slight swelling.    OBJECTIVE  BP (!) 154/70 (Site: Left Upper Arm, Position: Sitting, Cuff Size: Medium Adult)   Pulse 68   Temp 97.8 F (36.6 C) (Temporal)   Resp 16   Ht 1.549 m (5\' 1" )   Wt 52.6 kg (116 lb)   SpO2 99%   BMI 21.92 kg/m   Gen: Pleasant 83 y.o.  female in NAD.  EXTREMITIES: Warm.  She has some tenderness which appears to be overlying a vein located in the area described above.  There is slight discoloration and slight swelling there.

## 2023-02-27 NOTE — Progress Notes (Signed)
 This encounter was created in error - please disregard.

## 2023-03-08 MED ORDER — DULOXETINE HCL 20 MG PO CPEP
20 | ORAL_CAPSULE | Freq: Two times a day (BID) | ORAL | 3 refills | Status: DC
Start: 2023-03-08 — End: 2023-03-13

## 2023-03-13 ENCOUNTER — Ambulatory Visit
Admit: 2023-03-13 | Payer: MEDICARE | Attending: Family | Primary: Student in an Organized Health Care Education/Training Program

## 2023-03-13 VITALS — BP 122/60 | HR 85 | Resp 18 | Ht 61.0 in | Wt 116.6 lb

## 2023-03-13 DIAGNOSIS — F03B Unspecified dementia, moderate, without behavioral disturbance, psychotic disturbance, mood disturbance, and anxiety (HCC): Secondary | ICD-10-CM

## 2023-03-13 NOTE — Progress Notes (Signed)
 Chief Complaint   Patient presents with    Dementia     Follow up - states her memory is doing pretty      1. Have you been to the ER, urgent care clinic since your last visit?  Hospitalized since your last visit? No     2. Have you seen or consulted any other health care providers outside of the [facility] since your last visit?  Include any pap smears or colon screening.  No

## 2023-03-13 NOTE — Progress Notes (Signed)
 [facility]  [address]  [address]  [place], [place] [address]  Tel: [phone]  Fax: [phone]      Date:  03/13/23     Name:  Zoe Martinez  DOB:  1940/05/04  MRN:  [id number]     PCP:  Gillian Scarce, MD    Chief Complaint   Patient presents with    Dementia     Follow up - states her memory is doing pretty        HISTORY OF PRESENT ILLNESS:  History of Present Illness  The patient is an 83 year old female here today for a regular follow-up appointment. She was last seen in August 2024. The patient is seen for moderate dementia. She has been continued on Aricept 10 mg daily as well as Namenda 10 mg twice a day. She also sees Dr. Sallyanne Kuster for her lower back and primary care is Dr. Lennox Grumbles.    History is reported by other person in the presence of the patient, her son.  She continues to see Dr. Sallyanne Kuster for her back and hip issues, with the most recent consultation occurring on 03/06/2023. An appointment for radiofrequency ablation has been scheduled. She reports no new or different symptoms, including lightheadedness, dizziness, or vision issues. Her treatment regimen includes a buprenorphine patch and gabapentin as needed.    In terms of her memory, she experienced an episode of disorientation a few weeks ago, failing to recognize her son while conversing with him. She occasionally exhibits behaviors such as hiding objects and packing belongings but has not attempted to leave the house. They have considered using a bandage with an AirTag for tracking purposes. She maintains good nutrition and hydration, adheres to her medication regimen, and has not experienced any falls. She occasionally struggles with sleep initiation but does not report any disturbing dreams. She is able to independently select her clothing.    MEDICATIONS  Aricept, Namenda, buprenorphine patch, gabapentin, duloxetine, melatonin.      Recap from last visit :   1.  Moderate dementia without behavioral  disturbance: Patient has not completed formal neuropsych testing, will defer referral at this time.  Advised for the time being to continue the Namenda 10 mg twice a day as well as the donepezil milligrams nightly.  Patient is still living independently with family living beside her.  Discussed safety concerns with patient wandering into the road, I have provided the patient with literature from the [employer] to talk about different options to ensure her safety.  Safety locks, cameras etc. they have previously asked about the Leqembi infusion, advised we would need updated brain MRI, a completed neuropsych test as well as an amyloid PET scan, will defer ordering at this time.  Patient's family is to contact us if any worsening signs or symptoms at which time we can modify plan of care accordingly. I discussed with the patient and family members in regards to the patient's cognitive limitations and need to ensure patient safety.  Attempts to minimize opportunities of harm is important.   Activities to be monitored, or avoided, would include cooking, ironing clothes, financial bill payments.   Having structure to a day is important for the patient.  Cognitive and physical activities should be considered daily.      2.  Lumbar DDD: Patient is monitored by [facility], we are managing the patient's gabapentin 300 mg 3 times a day as well as the Cymbalta 20 mg twice  a day.  And side effects medication discussed continue with home exercise program and walking as tolerated.    REVIEW OF SYSTEMS:     Review of Systems   Musculoskeletal:  Positive for back pain.   Psychiatric/Behavioral:  Positive for confusion, decreased concentration and sleep disturbance (a little trouble getting to sleep at times.).    All other systems reviewed and are negative.          Current Outpatient Medications   Medication Sig    HYDROcodone-acetaminophen 5-300 MG TABS as needed.    cyclobenzaprine (FLEXERIL) 10 MG tablet  Take 1 tablet by mouth nightly as needed    buprenorphine 20 MCG/HR PTWK APPLY TO THE UPPER BODY FOR 1 WEEK THEN REPLACE    lidocaine-prilocaine (EMLA) 2.5-2.5 % cream Apply topically as needed.    gabapentin (NEURONTIN) 300 MG capsule Take 1 capsule by mouth 3 times daily for 90 days. Max Daily Amount: 900 mg    memantine (NAMENDA) 10 MG tablet TAKE 1 TABLET BY MOUTH TWICE  DAILY    donepezil (ARICEPT) 10 MG tablet TAKE 1 TABLET BY MOUTH EVERY  NIGHT    famotidine (PEPCID) 20 MG tablet TAKE 1 TABLET BY MOUTH TWICE  DAILY    DULoxetine (CYMBALTA) 20 MG extended release capsule Take 1 capsule by mouth 2 times daily    loratadine (CLARITIN) 10 MG capsule   daily, 0 Refill(s)    Multiple Vitamin (MULTIVITAMIN PO) Take 1 tablet by mouth daily    acetaminophen (TYLENOL) 500 MG tablet Take 2 tablets by mouth every 6 hours as needed    docusate (COLACE, DULCOLAX) 100 MG CAPS Take 100 mg by mouth 2 times daily    ibuprofen (ADVIL;MOTRIN) 200 MG tablet Take 3 tablets by mouth every 6 hours as needed     No current facility-administered medications for this visit.     Allergies   Allergen Reactions    Flecainide Anaphylaxis    Iodinated Contrast Media Shortness Of Breath     Uncoded Allergy. Allergen: ASPARATANE  Uncoded Allergy. Allergen: ASPARATANE  Uncoded Allergy. Allergen: ASPARATANE  Uncoded Allergy. 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 No pertinent family history.   MRI Result (most recent):  MRI LUMBAR SPINE WO CONTRAST 11/02/2020    Narrative  INDICATION:  Lumbar radiculopathy    EXAMINATION:  MRI LUMBAR SPINE without CONTRAST  COMPARISON: None    TECHNIQUE: MR imaging of the lumbar spine was performed with sagittal T1, T2,  STIR;  axial T1, T2.  NO CONTRAST ADMINISTERED    FINDINGS:    Moderate dextroscoliosis of the thoracolumbar spine. Grade 1 anterolisthesis of  L2 on L3. Mild to moderate multilevel degenerative disc disease. No evidence for  acute fracture. No abnormality of the distal spinal cord.    T12-L1: No central or neural foraminal stenosis. Mild to moderate facet  arthropathy    L1/2: Small disc bulge causing no central or neural foraminal stenosis. Mild  facet arthropathy    L2/3: Anterolisthesis with disc osteophyte complex causing no significant  central stenosis. Narrowing of the left subarticular zone with moderate left and  no right neural foraminal stenosis    L3/4: Disc osteophyte complex, facet arthropathy, and ligamentum flavum  hypertrophy causing no significant central stenosis. Narrowing of the right  subarticular zone without any neural foraminal stenosis    L4/5: Disc osteophyte complex, facet arthropathy, and ligamentum flavum  hypertrophy causing no central stenosis. Right subarticular stenosis with mild  to moderate right and no left neural foraminal stenosis    L5/S1: No central or neural foraminal stenosis. Mild to moderate facet  arthropathy.    Impression  Mild to moderate degenerative changes most pronounced at L2-3 and L4-5.        PHYSICAL EXAMINATION:    Vitals:    03/13/23 1438   BP: 122/60   Site: Left Upper Arm   Position: Sitting   Cuff Size: Small Adult   Pulse: 85   Resp: 18   SpO2: 98%   Weight: 52.9 kg (116 lb  9.6 oz)   Height: 1.549 m (5\' 1" )       General:  Well defined, nourished, and well groomed individual in no acute distress.     Musculoskeletal:  Extremities revealed no edema and had full range of motion of joints.    Psych:  Good mood and bright affect with her son.    NEUROLOGICAL EXAMINATION:     Mental Status:   Alert and oriented to person, place, .  Attention span and concentration are limited. Clear speech. Moca: 11/30.    Cranial Nerves:   PERRLA.  Visual fields were full  EOM: no evidence of nystagmus  Facial sensation:  normal and symmetric  Facial motor: normal and symmetric, no facial droop noted.  Hearing intact  SCM strength intact  Tongue: midline without fasciculations    Motor Examination: Normal tone. 5/5 muscle strength in bilateral upper and lower extremities. No cogwheel rigidity.  No muscle wasting, no twitching or fasciculation noted.      Sensory exam:  Normal throughout to light touch in BUE.     Coordination:   Finger to nose and rapid arm movement testing was normal.  No resting or intention tremor.  Negative Romberg, negative pronator drift.      Gait and Station:  Relatively steady gait.  Normal arm swing.      Reflexes:  DTRs 2+ in bilateral biceps, brachioradialis, patella.    ASSESSMENT AND PLAN     Diagnosis Orders   1. 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 Degeneration of intervertebral disc of lumbar region with lower extremity pain        3. Sleep disturbance          Assessment & Plan  1. Moderate dementia.  Her condition remains stable with the current medication regimen of Aricept 10 mg daily and Namenda 10 mg twice a day.  No changes or adjustments have been made, she has shown some difficulty with memory and task completion, patient did complete the [facility] cognitive assessment. The family is advised to consider using surveillance tools like cameras or motion sensors  to ensure her safety, especially if she tends to wander. She will continue her current medications. If there are any changes in her condition, such as increased confusion or mood lability, the family should contact the clinic or Dr. Francesca Jewett for further assistance.  I discussed with the patient and family members in regards to the patient's cognitive limitations and need to ensure patient safety.  Attempts to minimize opportunities of harm is important.   Activities to be monitored, or avoided, would include cooking, ironing clothes, financial bill payments.   Having structure to a day is important for the patient.  Cognitive and physical activities should be considered daily.     2.  Lumbar DDD.   She is under the care of Dr. Sallyanne Kuster and has an appointment set up for RF ablation, which has previously helped improve her activity level. She is also using a buprenorphine patch and takes gabapentin and duloxetine for pain management. No changes to her pain management regimen are recommended at this time.    3.  Sleep disturbances:  She reports difficulty sleeping but is currently using melatonin, which seems to help. If her sleep issues worsen or begin to disrupt her husband's sleep, additional medications such as trazodone or Seroquel may be considered. If she becomes more confused or agitated in the evenings, BuSpar could be helpful.         Patient and/or family verbalized understand of all instructions and all questions/concerns were addressed.  Safety/side effects of medications discussed.    Patient remains a complex patient secondary to polypharmacy, significant comorbid conditions, and use of high-risk medications which complicate the decision making process related to patient's neurologic diagnosis.    The patient is to follow up in 6 months, sooner if needed.  The patient (or guardian, if applicable) and other individuals in attendance with the patient were advised that Artificial Intelligence will be utilized during  this visit to record, process the conversation to generate a clinical note, and support improvement of the AI technology. The patient (or guardian, if applicable) and other individuals in attendance at the appointment consented to the use of AI, including the recording.                   Wendall Stade, FNP-BC

## 2023-03-15 ENCOUNTER — Encounter

## 2023-03-15 MED ORDER — QUETIAPINE FUMARATE 25 MG PO TABS
25 | ORAL_TABLET | Freq: Every evening | ORAL | 1 refills | Status: AC
Start: 2023-03-15 — End: ?

## 2023-04-06 ENCOUNTER — Encounter

## 2023-04-06 MED ORDER — QUETIAPINE FUMARATE 25 MG PO TABS
25 MG | ORAL_TABLET | Freq: Every evening | ORAL | 1 refills | Status: DC
Start: 2023-04-06 — End: 2023-05-01

## 2023-04-06 NOTE — Telephone Encounter (Signed)
 Last OV was 03/13/23  Last filled on 03/15/23 # 30 with one refill

## 2023-04-29 ENCOUNTER — Encounter

## 2023-04-30 ENCOUNTER — Encounter

## 2023-05-01 MED ORDER — QUETIAPINE FUMARATE 25 MG PO TABS
25 | ORAL_TABLET | Freq: Every evening | ORAL | 1 refills | Status: DC
Start: 2023-05-01 — End: 2023-10-19

## 2023-05-02 MED ORDER — GABAPENTIN 300 MG PO CAPS
300 | ORAL_CAPSULE | ORAL | 3 refills | Status: DC
Start: 2023-05-02 — End: 2023-11-21

## 2023-05-02 NOTE — Telephone Encounter (Signed)
 Last OV was 03/13/23  Last filled 09/04/22 # 270 with 2 refills

## 2023-05-15 ENCOUNTER — Encounter

## 2023-05-22 ENCOUNTER — Inpatient Hospital Stay: Admit: 2023-05-22 | Payer: MEDICARE | Primary: Student in an Organized Health Care Education/Training Program

## 2023-05-22 VITALS — BP 150/63 | HR 66 | Temp 97.90000°F

## 2023-05-22 DIAGNOSIS — M810 Age-related osteoporosis without current pathological fracture: Secondary | ICD-10-CM

## 2023-05-22 LAB — POC CHEM 8
Anion Gap, POC: 4.7 mmol/L — ABNORMAL LOW (ref 10–20)
POC Chloride: 105 mmol/L (ref 98–107)
POC Creatinine: 0.82 mg/dL (ref 0.6–1.3)
POC Glucose: 134 mg/dL — ABNORMAL HIGH (ref 74–99)
POC Ionized Calcium: 1.43 mmol/L — ABNORMAL HIGH (ref 1.15–1.33)
POC Potassium: 4.4 mmol/L (ref 3.5–5.1)
POC Sodium: 140 mmol/L (ref 136–145)
POC TCO2: 30.3 mmol/L (ref 21–32)
eGFR, POC: 71 mL/min/{1.73_m2} (ref >60)

## 2023-05-22 MED ORDER — DENOSUMAB 60 MG/ML SC SOSY
60 | Freq: Once | SUBCUTANEOUS | Status: AC
Start: 2023-05-22 — End: 2023-05-22
  Administered 2023-05-22: 20:00:00 60 mg via SUBCUTANEOUS

## 2023-05-22 MED FILL — PROLIA 60 MG/ML SC SOSY: 60 MG/ML | SUBCUTANEOUS | Qty: 1 | Fill #0

## 2023-05-22 NOTE — Progress Notes (Signed)
 [facility] Progress Note      Date: May 22, 2023    Name: Zoe Martinez    MRN: [id number]         DOB: Feb 28, 1940    1530 Patient arrives for Prolia  without acute problems. Please see Epic for complete assessment and education provided.    Vital signs stable throughout and prior to discharge. Patient tolerated procedure well and was discharged without incident. Patient is aware of next [facility] appointment on 11/20/2023. Appointment card give to the Patient.      Zoe Martinez vitals were reviewed prior to and after treatment.   Patient Vitals for the past 12 hrs:   Temp Pulse BP   05/22/23 1530 97.9 F (36.6 C) 66 (!) 150/63         Lab results were obtained and reviewed.  Recent Results (from the past 12 hours)   POC CHEM 8    Collection Time: 05/22/23  3:36 PM   Result Value Ref Range    POC Ionized Calcium 1.43 (H) 1.15 - 1.33 mmol/L    POC Sodium 140 136 - 145 mmol/L    POC Potassium 4.4 3.5 - 5.1 mmol/L    POC Chloride 105 98 - 107 mmol/L    POC TCO2 30.3 21 - 32 mmol/L    Anion Gap, POC 4.7 (L) 10 - 20 mmol/L    POC Glucose 134 (H) 74 - 99 mg/dL    POC Creatinine 8.46 0.6 - 1.3 mg/dL    eGFR, POC 71 >96 EX/BMW/4.13K4    UA Comment Comment Not Indicated.         Medications given:   Medications Administered         denosumab  (PROLIA ) SC injection 60 mg Admin Date  05/22/2023 Action  Given Dose  60 mg Route  SubCUTAneous Documented By  Rafe Bunde, RN            Zoe Martinez tolerated the injection, and had no complaints.    [facility]. Discharge Instructions provided to patient, patient verbalized understanding but denied the request for a copy of after visit summary.     Future Appointments   Date Time Provider Department Center   09/10/2023  2:30 PM Claudetta Cuba, APRN - NP [id number]   11/20/2023  3:00 PM [facility] [address] [facility]       Rafe Bunde, RN  May 22, 2023  3:53 PM

## 2023-05-25 NOTE — Telephone Encounter (Signed)
 I did not see notation regarding the cymbalta 

## 2023-05-28 MED ORDER — DULOXETINE HCL 20 MG PO CPEP
20 | ORAL_CAPSULE | Freq: Two times a day (BID) | ORAL | 1 refills | 30.00000 days | Status: DC
Start: 2023-05-28 — End: 2023-10-19

## 2023-06-05 MED ORDER — FAMOTIDINE 20 MG PO TABS
20 | ORAL_TABLET | Freq: Two times a day (BID) | ORAL | 3 refills | 30.00000 days | Status: AC
Start: 2023-06-05 — End: ?

## 2023-07-05 ENCOUNTER — Encounter

## 2023-07-05 MED ORDER — DONEPEZIL HCL 10 MG PO TABS
10 | ORAL_TABLET | Freq: Every evening | ORAL | 3 refills | 30.00 days | Status: AC
Start: 2023-07-05 — End: ?

## 2023-07-05 MED ORDER — MEMANTINE HCL 10 MG PO TABS
10 | ORAL_TABLET | Freq: Two times a day (BID) | ORAL | 3 refills | 30.00 days | Status: AC
Start: 2023-07-05 — End: ?

## 2023-09-10 ENCOUNTER — Ambulatory Visit: Admit: 2023-09-10 | Payer: MEDICARE | Attending: Family | Primary: Geriatric Medicine

## 2023-09-10 VITALS — BP 128/70 | HR 81 | Resp 18 | Ht 61.0 in | Wt 123.0 lb

## 2023-09-10 DIAGNOSIS — F03B Unspecified dementia, moderate, without behavioral disturbance, psychotic disturbance, mood disturbance, and anxiety (HCC): Principal | ICD-10-CM

## 2023-09-10 NOTE — Progress Notes (Signed)
 [facility]  [address]  [address]  [place], [place]  [address]  Tel: [phone]  Fax: [phone]      Date:  09/10/23     Name:  Zoe Martinez  DOB:  [birth year]  MRN:  [id number]     PCP:  Audra Joylene LABOR, MD    Chief Complaint   Patient presents with    Dementia     Follow up -       HISTORY OF PRESENT ILLNESS:  History of Present Illness  The patient is an 83 year old female here today for a regular follow-up appointment. The primary reason for this visit is to monitor her moderate degree of dementia. She was last seen in 02/2023 and has been continued on Namenda  10 mg twice a day and Aricept  10mg  nightly. Seroquel  was added at last visit and it has really helped with her sleep.     Over the past few months, she reports she has been doing well, no hospitalizations. Her appetite remains good, and she has gained approximately 8 pounds. She does not drive and has not experienced any instances of leaving the house and getting locked out.  She is compliant with her medication regimen. Occasionally, she experiences back pain. She is able to dress herself if the clothes are laid out for her.  She spends her time  rearranging items like silverware and dishes. She does not watch TV or read much. She is able to use the bathroom  some but son reports she has had a few accidents and  overnight bed wetting, which is a new problem, she forgets to put the depends back on.   Occasionally, she feels melancholy, particularly on cloudy days.    On 08/17/2023, she had a fall in the kitchen where she hit her head on the stove but did not sustain any visible injuries. She did not experience any dizziness, headaches, or confusion following the incident. She has been using [employer], which transmits within a 1-mile radius, but it has not been needed so far. She has been taking Seroquel  at night, which has improved her sleep pattern. She continues to take Namenda , Aricept , and  Cymbalta .    However, she has been experiencing some hygiene issues, such as not wearing underpants and wetting the bed. She has someone assisting her with bathing twice a week. A recent check for a urinary tract infection (UTI) was negative. Labs checked in June.          Recap from last visit :   1. Moderate dementia.  Her condition remains stable with the current medication regimen of Aricept  10 mg daily and Namenda  10 mg twice a day.  No changes or adjustments have been made, she has shown some difficulty with memory and task completion, patient did complete the [facility] cognitive assessment. The family is advised to consider using surveillance tools like cameras or motion sensors to ensure her safety, especially if she tends to wander. She will continue her current medications. If there are any changes in her condition, such as increased confusion or mood lability, the family should contact the clinic or Dr. Honore for further assistance.  I discussed with the patient and family members in regards to the patient's cognitive limitations and need to ensure patient safety.  Attempts to minimize opportunities of harm is important.   Activities to be monitored, or avoided, would include cooking, ironing clothes, financial bill payments.   Having structure to a day is  important for the patient.  Cognitive and physical activities should be considered daily.      2.  Lumbar DDD.   She is under the care of Dr. Malissa and has an appointment set up for RF ablation, which has previously helped improve her activity level. She is also using a buprenorphine patch and takes gabapentin  and duloxetine  for pain management. No changes to her pain management regimen are recommended at this time.     3.  Sleep disturbances:  She reports difficulty sleeping but is currently using melatonin, which seems to help. If her sleep issues worsen or begin to disrupt her husband's sleep, additional medications such as trazodone or Seroquel  may be  considered. If she becomes more confused or agitated in the evenings, BuSpar could be helpful.    REVIEW OF SYSTEMS:     Review of Systems   Musculoskeletal:  Positive for back pain. Negative for gait problem.   Neurological: Negative.    Psychiatric/Behavioral:  Positive for confusion, decreased concentration, dysphoric mood and sleep disturbance (better with the Seroquel ).    All other systems reviewed and are negative.          Current Outpatient Medications   Medication Sig    donepezil  (ARICEPT ) 10 MG tablet Take 1 tablet by mouth nightly    memantine  (NAMENDA ) 10 MG tablet Take 1 tablet by mouth 2 times daily    famotidine  (PEPCID ) 20 MG tablet TAKE 1 TABLET BY MOUTH TWICE  DAILY    DULoxetine  (CYMBALTA ) 20 MG extended release capsule Take 1 capsule by mouth 2 times daily    gabapentin  (NEURONTIN ) 300 MG capsule TAKE 1 CAPSULE BY MOUTH 3 TIMES  DAILY MAX DAILY AMOUNT: 900 MG (Patient taking differently: Take 1 capsule by mouth daily.)    QUEtiapine  (SEROQUEL ) 25 MG tablet Take 1 tablet by mouth nightly    HYDROcodone-acetaminophen 5-300 MG TABS as needed.    cyclobenzaprine (FLEXERIL) 10 MG tablet Take 1 tablet by mouth nightly as needed (Patient taking differently: Take 1 tablet by mouth nightly as needed Takes 1/2 tab daily)    buprenorphine 20 MCG/HR PTWK APPLY TO THE UPPER BODY FOR 1 WEEK THEN REPLACE    loratadine (CLARITIN) 10 MG capsule   daily, 0 Refill(s)    Multiple Vitamin (MULTIVITAMIN PO) Take 1 tablet by mouth daily    acetaminophen (TYLENOL) 500 MG tablet Take 2 tablets by mouth every 6 hours as needed    docusate (COLACE, DULCOLAX) 100 MG CAPS Take 100 mg by mouth 2 times daily    ibuprofen (ADVIL;MOTRIN) 200 MG tablet Take 3 tablets by mouth every 6 hours as needed     No current facility-administered medications for this visit.     Allergies   Allergen Reactions    Flecainide Anaphylaxis    Iodinated Contrast Media Shortness Of Breath     Uncoded Allergy. Allergen: ASPARATANE  Uncoded  Allergy. Allergen: ASPARATANE  Uncoded Allergy. Allergen: ASPARATANE  Uncoded Allergy. 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   MRI Result (most recent):  MRI LUMBAR SPINE WO CONTRAST 11/02/2020    Narrative  INDICATION:  Lumbar radiculopathy    EXAMINATION:  MRI LUMBAR SPINE without CONTRAST    COMPARISON: None    TECHNIQUE: MR imaging of the lumbar spine was performed with sagittal T1, T2,  STIR;  axial T1, T2.  NO CONTRAST ADMINISTERED    FINDINGS:    Moderate dextroscoliosis of the thoracolumbar spine. Grade 1 anterolisthesis of  L2 on L3. Mild to moderate multilevel degenerative disc disease. No evidence for  acute fracture. No abnormality of the distal spinal cord.    T12-L1: No central or neural foraminal stenosis. Mild to moderate facet  arthropathy    L1/2: Small disc bulge causing no central or neural foraminal stenosis. Mild  facet arthropathy    L2/3: Anterolisthesis with disc osteophyte complex causing no significant  central stenosis. Narrowing of the left subarticular zone with moderate left and  no right neural foraminal stenosis    L3/4: Disc osteophyte complex, facet arthropathy, and ligamentum flavum  hypertrophy causing  no significant central stenosis. Narrowing of the right  subarticular zone without any neural foraminal stenosis    L4/5: Disc osteophyte complex, facet arthropathy, and ligamentum flavum  hypertrophy causing no central stenosis. Right subarticular stenosis with mild  to moderate right and no left neural foraminal stenosis    L5/S1: No central or neural foraminal stenosis. Mild to moderate facet  arthropathy.    Impression  Mild to moderate degenerative changes most pronounced at L2-3 and L4-5.        PHYSICAL EXAMINATION:    Vitals:    09/10/23 1443   BP: 128/70   BP Site: Left Upper Arm   Patient Position: Sitting   BP Cuff Size: Small Adult   Pulse: 81   Resp: 18   SpO2: 94%   Weight: 55.8 kg (123 lb)   Height: 1.549 m (5' 1)       General:  Well defined, nourished, and well groomed individual in no acute distress.     Musculoskeletal:  Extremities revealed no edema and had full range of motion of joints.    Psych:  Good mood and bright affect with her son and daughter in law.    NEUROLOGICAL EXAMINATION:     Mental Status:   Alert and oriented to person, noted the place to be [place], unable to name the month, or the year. She couldn't name her son and daughter in law, called them her friends.    Attention span and concentration are limited. Clear speech.   Clock test: drew the circle, numbers were placed on the clock backwards.  Word Recall: 0/3.   Pt able to spell world backwards: no    Cranial Nerves:   PERRLA.  Visual fields were full  EOM: no evidence of nystagmus  Facial sensation:  normal and symmetric  Facial motor: normal and symmetric, no facial droop noted.  Hearing intact  SCM strength intact  Tongue: midline without fasciculations    Motor Examination: Normal tone. 5/5 muscle strength in bilateral upper and lower extremities. No cogwheel rigidity.  No muscle wasting, no twitching or fasciculation noted.      Sensory exam:  Normal throughout to light touch  in BUE.     Coordination:   Finger to  nose and rapid arm movement testing was normal.  No resting or intention tremor.  Negative Romberg, negative pronator drift.      Gait and Station:  Steady gait.  Normal arm swing.      Reflexes:  DTRs 2+ in bilateral biceps, brachioradialis, patella.    ASSESSMENT AND PLAN     Diagnosis Orders   1. 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 Degeneration of intervertebral disc of lumbar region with lower extremity pain        3. Sleep disturbance          Assessment & Plan  1. Dementia.  Her condition appears to be stable with no significant changes noted since the last visit. She has gained weight and is eating well. Her physical examination was unremarkable. She is currently taking Namenda  10 mg twice a day and Aricept  10 mg nightly. She will continue her current medication regimen . The family is advised to continue assisting with her hygiene and monitoring for any changes in her condition.  There are no major safety or behavior concerns at this time, however, She has been experiencing hygiene challenges, including not wearing underpants and wetting the bed. The family is advised to assist her with bathing twice a week and ensure she wears appropriate clothing to prevent accidents. If necessary, they should help her with bathroom visits before bed to avoid changing sheets. I discussed with the patient and family members in regards to the patient's cognitive limitations and need to ensure patient safety.  Attempts to minimize opportunities of harm is important.   Activities to be monitored, or avoided, would include cooking, ironing clothes, financial bill payments.   Having structure to a day is important for the patient.  Cognitive and physical activities should be considered daily.       2.  Lumbar degeneration.  Patient is advised to continue medications as prescribed which includes the gabapentin  as well as  Cymbalta .  Continue with home exercise program as indicated.  Defer any supplemental testing or imaging at this time.  Patient and or family are advised to contact us  for any worsening signs or symptoms.    3.  Sleep disturbances.  Patient notes improvement with the addition of the Seroquel  25 mg nightly, she is to continue as prescribed, sleep hygiene encouraged    Follow-up  The patient will follow up in 6 months.         Patient and/or family verbalized understand of all instructions and all questions/concerns were addressed.  Safety/side effects of medications discussed.    Patient remains a complex patient secondary to polypharmacy, significant comorbid conditions, and use of high-risk medications which complicate the decision making process related to patient's neurologic diagnosis.    The patient is to follow up in 6 months, sooner if needed.  The patient (or guardian, if applicable) and other individuals in attendance with the patient were advised that Artificial Intelligence will be  utilized during this visit to record, process the conversation to generate a clinical note, and support improvement of the AI technology. The patient (or guardian, if applicable) and other individuals in attendance at the appointment consented to the use of AI, including the recording.                   Amed Datta, FNP-BC

## 2023-09-10 NOTE — Progress Notes (Signed)
 Chief Complaint   Patient presents with    Dementia     Follow up -     1. Have you been to the ER, urgent care clinic since your last visit?  Hospitalized since your last visit? No     2. Have you seen or consulted any other health care providers outside of the [facility] since your last visit?  Include any pap smears or colon screening.  Yes PCP

## 2023-10-19 ENCOUNTER — Encounter

## 2023-10-19 MED ORDER — QUETIAPINE FUMARATE 25 MG PO TABS
25 | ORAL_TABLET | Freq: Every evening | ORAL | 3 refills | 30.00000 days | Status: AC
Start: 2023-10-19 — End: ?

## 2023-10-19 MED ORDER — DULOXETINE HCL 20 MG PO CPEP
20 | ORAL_CAPSULE | Freq: Two times a day (BID) | ORAL | 3 refills | 30.00000 days | Status: AC
Start: 2023-10-19 — End: ?

## 2023-10-19 NOTE — Telephone Encounter (Signed)
 Last OV was 09/10/23  Last filled Duloxetine  on 05/28/23 # 180 with one refill and Quetiapine  # 90 with one refill

## 2023-11-16 ENCOUNTER — Encounter

## 2023-11-20 ENCOUNTER — Inpatient Hospital Stay: Admit: 2023-11-20 | Payer: MEDICARE | Primary: Geriatric Medicine

## 2023-11-20 VITALS — BP 123/67 | HR 88 | Temp 97.70000°F | Resp 17 | Ht 60.0 in | Wt 120.0 lb

## 2023-11-20 DIAGNOSIS — M810 Age-related osteoporosis without current pathological fracture: Principal | ICD-10-CM

## 2023-11-20 LAB — POC CHEM 8
Anion Gap, POC: 8.1 mmol/L — ABNORMAL LOW (ref 10–20)
POC Chloride: 109 mmol/L — ABNORMAL HIGH (ref 98–107)
POC Creatinine: 1.18 mg/dL (ref 0.6–1.3)
POC Glucose: 159 mg/dL — ABNORMAL HIGH (ref 74–99)
POC Ionized Calcium: 1.37 mmol/L — ABNORMAL HIGH (ref 1.15–1.33)
POC Potassium: 4.4 mmol/L (ref 3.5–5.1)
POC Sodium: 142 mmol/L (ref 136–145)
POC TCO2: 24.9 mmol/L (ref 21–32)
eGFR, POC: 46 ml/min/1.73m2 — ABNORMAL LOW (ref >60)

## 2023-11-20 MED ORDER — DENOSUMAB 60 MG/ML SC SOSY
60 | Freq: Once | SUBCUTANEOUS | Status: AC
Start: 2023-11-20 — End: 2023-11-20
  Administered 2023-11-20: 19:00:00 60 mg via SUBCUTANEOUS

## 2023-11-20 MED FILL — PROLIA 60 MG/ML SC SOSY: 60 mg/mL | SUBCUTANEOUS | Qty: 1 | Fill #0

## 2023-11-20 NOTE — Telephone Encounter (Signed)
"  Patients daughter in law Milderd called to check on the status of the gabapentin . Pt will be completely out of the medication on Saturday and the medication will be sent through the mail order pharmacy. She would like to have a call back with the status at [phone]  "

## 2023-11-20 NOTE — Telephone Encounter (Signed)
"  Zoe Martinez (on phi) is calling to follow up on her previous message requesting Gabapentin  refills.   "

## 2023-11-20 NOTE — Progress Notes (Signed)
"  [facility] Visit Note:  Arrived - 1500 for Prolia  Injection.    BP 123/67   Pulse 88   Temp 97.7 F (36.5 C) (Temporal)   Resp 17   Ht 1.524 m (5')   Wt 54.4 kg (120 lb)   SpO2 93%   BMI 23.44 kg/m     Assessment - completed. Reviewed Prolia  info. Pt denies any recent or upcoming dental work.    Labs obtained peripherally:   Recent Results (from the past 12 hours)   POC CHEM 8    Collection Time: 11/20/23  3:14 PM   Result Value Ref Range    POC Ionized Calcium 1.37 (H) 1.15 - 1.33 mmol/L    POC Sodium 142 136 - 145 mmol/L    POC Potassium 4.4 3.5 - 5.1 mmol/L    POC Chloride 109 (H) 98 - 107 mmol/L    POC TCO2 24.9 21 - 32 mmol/L    Anion Gap, POC 8.1 (L) 10 - 20 mmol/L    POC Glucose 159 (H) 74 - 99 mg/dL    POC Creatinine 8.81 0.6 - 1.3 mg/dL    eGFR, POC 46 (L) >39 ml/min/1.49m2    UA Comment Comment Not Indicated.       Medication given:  Medications Administered         denosumab  (PROLIA ) SC injection 60 mg Admin Date  11/20/2023 Action  Given Dose  60 mg Route  SubCUTAneous Documented By  Mardee Soulier, RN          Prolia  60mg  SC in left arm    1530 - Tolerated well. No reaction noted. Reviewed Prolia  D/C instructions. Verbalized understanding. Pt denies any acute problems/changes. Discharged from [facility] ambulatory. No distress. Patient's daughter in law will reach out to PCP for new orders and will call to schedule next appointment.     "

## 2023-11-21 MED ORDER — GABAPENTIN 300 MG PO CAPS
300 | ORAL_CAPSULE | ORAL | 3 refills | 30.00000 days | Status: AC
Start: 2023-11-21 — End: 2024-05-19

## 2023-11-21 NOTE — Telephone Encounter (Signed)
"  Last OV was 09/10/23. Last filled 05/02/23 # 270 with 3 refills   "

## 2024-01-14 NOTE — Telephone Encounter (Signed)
"  Erania/[facility] called to request office notes. Please send to   Fax [phone]  Office [phone]   "

## 2024-01-15 NOTE — Telephone Encounter (Signed)
"  Faxed last OV note to [facility] at [phone]. Received confirmation that fax went through successfully   Confirmation put in quick scan    "
